# Patient Record
Sex: Male | Born: 1980 | Race: Black or African American | Hispanic: No | Marital: Single | Smoking: Never smoker
Health system: Southern US, Community
[De-identification: ages and names within clinical notes are randomized; demographics above are authoritative.]

## PROBLEM LIST (undated history)

## (undated) DIAGNOSIS — I10 Essential (primary) hypertension: Secondary | ICD-10-CM

## (undated) DIAGNOSIS — I639 Cerebral infarction, unspecified: Secondary | ICD-10-CM

---

## 2005-10-08 ENCOUNTER — Emergency Department (HOSPITAL_COMMUNITY): Admission: EM | Admit: 2005-10-08 | Discharge: 2005-10-08 | Payer: Self-pay | Admitting: Emergency Medicine

## 2005-10-28 ENCOUNTER — Emergency Department (HOSPITAL_COMMUNITY): Admission: EM | Admit: 2005-10-28 | Discharge: 2005-10-28 | Payer: Self-pay | Admitting: Emergency Medicine

## 2020-10-25 ENCOUNTER — Emergency Department (HOSPITAL_COMMUNITY): Payer: Medicaid Other

## 2020-10-25 ENCOUNTER — Other Ambulatory Visit: Payer: Self-pay

## 2020-10-25 ENCOUNTER — Encounter (HOSPITAL_COMMUNITY): Payer: Self-pay | Admitting: Emergency Medicine

## 2020-10-25 ENCOUNTER — Inpatient Hospital Stay (HOSPITAL_COMMUNITY)
Admission: EM | Admit: 2020-10-25 | Discharge: 2020-11-12 | DRG: 023 | Disposition: E | Payer: Medicaid Other | Attending: Neurology | Admitting: Neurology

## 2020-10-25 DIAGNOSIS — J80 Acute respiratory distress syndrome: Secondary | ICD-10-CM | POA: Diagnosis present

## 2020-10-25 DIAGNOSIS — Z09 Encounter for follow-up examination after completed treatment for conditions other than malignant neoplasm: Secondary | ICD-10-CM

## 2020-10-25 DIAGNOSIS — R578 Other shock: Secondary | ICD-10-CM | POA: Diagnosis not present

## 2020-10-25 DIAGNOSIS — G9382 Brain death: Secondary | ICD-10-CM | POA: Diagnosis not present

## 2020-10-25 DIAGNOSIS — Z0189 Encounter for other specified special examinations: Secondary | ICD-10-CM

## 2020-10-25 DIAGNOSIS — R509 Fever, unspecified: Secondary | ICD-10-CM

## 2020-10-25 DIAGNOSIS — I63412 Cerebral infarction due to embolism of left middle cerebral artery: Principal | ICD-10-CM | POA: Diagnosis present

## 2020-10-25 DIAGNOSIS — J9690 Respiratory failure, unspecified, unspecified whether with hypoxia or hypercapnia: Secondary | ICD-10-CM

## 2020-10-25 DIAGNOSIS — J690 Pneumonitis due to inhalation of food and vomit: Secondary | ICD-10-CM | POA: Diagnosis present

## 2020-10-25 DIAGNOSIS — F17210 Nicotine dependence, cigarettes, uncomplicated: Secondary | ICD-10-CM | POA: Diagnosis present

## 2020-10-25 DIAGNOSIS — J189 Pneumonia, unspecified organism: Secondary | ICD-10-CM | POA: Diagnosis not present

## 2020-10-25 DIAGNOSIS — I130 Hypertensive heart and chronic kidney disease with heart failure and stage 1 through stage 4 chronic kidney disease, or unspecified chronic kidney disease: Secondary | ICD-10-CM | POA: Diagnosis present

## 2020-10-25 DIAGNOSIS — E870 Hyperosmolality and hypernatremia: Secondary | ICD-10-CM | POA: Diagnosis not present

## 2020-10-25 DIAGNOSIS — N184 Chronic kidney disease, stage 4 (severe): Secondary | ICD-10-CM | POA: Diagnosis present

## 2020-10-25 DIAGNOSIS — Z79899 Other long term (current) drug therapy: Secondary | ICD-10-CM

## 2020-10-25 DIAGNOSIS — E1122 Type 2 diabetes mellitus with diabetic chronic kidney disease: Secondary | ICD-10-CM | POA: Diagnosis present

## 2020-10-25 DIAGNOSIS — I611 Nontraumatic intracerebral hemorrhage in hemisphere, cortical: Secondary | ICD-10-CM | POA: Diagnosis present

## 2020-10-25 DIAGNOSIS — G9349 Other encephalopathy: Secondary | ICD-10-CM | POA: Diagnosis present

## 2020-10-25 DIAGNOSIS — Z9289 Personal history of other medical treatment: Secondary | ICD-10-CM

## 2020-10-25 DIAGNOSIS — E669 Obesity, unspecified: Secondary | ICD-10-CM | POA: Diagnosis present

## 2020-10-25 DIAGNOSIS — Z452 Encounter for adjustment and management of vascular access device: Secondary | ICD-10-CM

## 2020-10-25 DIAGNOSIS — N179 Acute kidney failure, unspecified: Secondary | ICD-10-CM | POA: Diagnosis present

## 2020-10-25 DIAGNOSIS — R29704 NIHSS score 4: Secondary | ICD-10-CM | POA: Diagnosis present

## 2020-10-25 DIAGNOSIS — F1220 Cannabis dependence, uncomplicated: Secondary | ICD-10-CM | POA: Diagnosis present

## 2020-10-25 DIAGNOSIS — E785 Hyperlipidemia, unspecified: Secondary | ICD-10-CM | POA: Diagnosis present

## 2020-10-25 DIAGNOSIS — J9691 Respiratory failure, unspecified with hypoxia: Secondary | ICD-10-CM

## 2020-10-25 DIAGNOSIS — Z978 Presence of other specified devices: Secondary | ICD-10-CM

## 2020-10-25 DIAGNOSIS — E876 Hypokalemia: Secondary | ICD-10-CM | POA: Diagnosis present

## 2020-10-25 DIAGNOSIS — I63442 Cerebral infarction due to embolism of left cerebellar artery: Secondary | ICD-10-CM | POA: Diagnosis present

## 2020-10-25 DIAGNOSIS — R6521 Severe sepsis with septic shock: Secondary | ICD-10-CM | POA: Diagnosis not present

## 2020-10-25 DIAGNOSIS — G911 Obstructive hydrocephalus: Secondary | ICD-10-CM | POA: Diagnosis present

## 2020-10-25 DIAGNOSIS — Z20822 Contact with and (suspected) exposure to covid-19: Secondary | ICD-10-CM | POA: Diagnosis present

## 2020-10-25 DIAGNOSIS — I5043 Acute on chronic combined systolic (congestive) and diastolic (congestive) heart failure: Secondary | ICD-10-CM | POA: Diagnosis present

## 2020-10-25 DIAGNOSIS — Z515 Encounter for palliative care: Secondary | ICD-10-CM

## 2020-10-25 DIAGNOSIS — Z4659 Encounter for fitting and adjustment of other gastrointestinal appliance and device: Secondary | ICD-10-CM

## 2020-10-25 DIAGNOSIS — K7201 Acute and subacute hepatic failure with coma: Secondary | ICD-10-CM | POA: Diagnosis present

## 2020-10-25 DIAGNOSIS — I639 Cerebral infarction, unspecified: Secondary | ICD-10-CM

## 2020-10-25 DIAGNOSIS — E871 Hypo-osmolality and hyponatremia: Secondary | ICD-10-CM | POA: Diagnosis present

## 2020-10-25 DIAGNOSIS — G936 Cerebral edema: Secondary | ICD-10-CM | POA: Diagnosis present

## 2020-10-25 DIAGNOSIS — H518 Other specified disorders of binocular movement: Secondary | ICD-10-CM | POA: Diagnosis present

## 2020-10-25 DIAGNOSIS — I63512 Cerebral infarction due to unspecified occlusion or stenosis of left middle cerebral artery: Secondary | ICD-10-CM | POA: Diagnosis present

## 2020-10-25 DIAGNOSIS — I513 Intracardiac thrombosis, not elsewhere classified: Secondary | ICD-10-CM | POA: Diagnosis present

## 2020-10-25 DIAGNOSIS — Z6841 Body Mass Index (BMI) 40.0 and over, adult: Secondary | ICD-10-CM

## 2020-10-25 DIAGNOSIS — A419 Sepsis, unspecified organism: Secondary | ICD-10-CM | POA: Diagnosis not present

## 2020-10-25 DIAGNOSIS — E1165 Type 2 diabetes mellitus with hyperglycemia: Secondary | ICD-10-CM | POA: Diagnosis present

## 2020-10-25 DIAGNOSIS — G935 Compression of brain: Secondary | ICD-10-CM | POA: Diagnosis present

## 2020-10-25 DIAGNOSIS — I69351 Hemiplegia and hemiparesis following cerebral infarction affecting right dominant side: Secondary | ICD-10-CM

## 2020-10-25 DIAGNOSIS — I615 Nontraumatic intracerebral hemorrhage, intraventricular: Secondary | ICD-10-CM | POA: Diagnosis present

## 2020-10-25 HISTORY — DX: Cerebral infarction, unspecified: I63.9

## 2020-10-25 HISTORY — DX: Essential (primary) hypertension: I10

## 2020-10-25 LAB — CBC
HCT: 35.6 % — ABNORMAL LOW (ref 39.0–52.0)
Hemoglobin: 11.8 g/dL — ABNORMAL LOW (ref 13.0–17.0)
MCH: 25.8 pg — ABNORMAL LOW (ref 26.0–34.0)
MCHC: 33.1 g/dL (ref 30.0–36.0)
MCV: 77.7 fL — ABNORMAL LOW (ref 80.0–100.0)
Platelets: 476 10*3/uL — ABNORMAL HIGH (ref 150–400)
RBC: 4.58 MIL/uL (ref 4.22–5.81)
RDW: 16 % — ABNORMAL HIGH (ref 11.5–15.5)
WBC: 15.9 10*3/uL — ABNORMAL HIGH (ref 4.0–10.5)
nRBC: 1.4 % — ABNORMAL HIGH (ref 0.0–0.2)

## 2020-10-25 LAB — DIFFERENTIAL
Abs Immature Granulocytes: 0.18 10*3/uL — ABNORMAL HIGH (ref 0.00–0.07)
Basophils Absolute: 0 10*3/uL (ref 0.0–0.1)
Basophils Relative: 0 %
Eosinophils Absolute: 0 10*3/uL (ref 0.0–0.5)
Eosinophils Relative: 0 %
Immature Granulocytes: 1 %
Lymphocytes Relative: 8 %
Lymphs Abs: 1.2 10*3/uL (ref 0.7–4.0)
Monocytes Absolute: 1 10*3/uL (ref 0.1–1.0)
Monocytes Relative: 7 %
Neutro Abs: 13.4 10*3/uL — ABNORMAL HIGH (ref 1.7–7.7)
Neutrophils Relative %: 84 %

## 2020-10-25 LAB — COMPREHENSIVE METABOLIC PANEL
ALT: 66 U/L — ABNORMAL HIGH (ref 0–44)
AST: 40 U/L (ref 15–41)
Albumin: 1.5 g/dL — ABNORMAL LOW (ref 3.5–5.0)
Alkaline Phosphatase: 207 U/L — ABNORMAL HIGH (ref 38–126)
Anion gap: 11 (ref 5–15)
BUN: 41 mg/dL — ABNORMAL HIGH (ref 6–20)
CO2: 23 mmol/L (ref 22–32)
Calcium: 7.8 mg/dL — ABNORMAL LOW (ref 8.9–10.3)
Chloride: 93 mmol/L — ABNORMAL LOW (ref 98–111)
Creatinine, Ser: 2 mg/dL — ABNORMAL HIGH (ref 0.61–1.24)
GFR, Estimated: 43 mL/min — ABNORMAL LOW (ref >60)
Glucose, Bld: 289 mg/dL — ABNORMAL HIGH (ref 70–99)
Potassium: 3.4 mmol/L — ABNORMAL LOW (ref 3.5–5.1)
Sodium: 127 mmol/L — ABNORMAL LOW (ref 135–145)
Total Bilirubin: 1.2 mg/dL (ref 0.3–1.2)
Total Protein: 7 g/dL (ref 6.5–8.1)

## 2020-10-25 LAB — PROTIME-INR
INR: 1.4 — ABNORMAL HIGH (ref 0.8–1.2)
Prothrombin Time: 16.7 seconds — ABNORMAL HIGH (ref 11.4–15.2)

## 2020-10-25 LAB — CBG MONITORING, ED: Glucose-Capillary: 281 mg/dL — ABNORMAL HIGH (ref 70–99)

## 2020-10-25 LAB — APTT: aPTT: 27 seconds (ref 24–36)

## 2020-10-25 LAB — ETHANOL: Alcohol, Ethyl (B): <10 mg/dL (ref <10)

## 2020-10-25 MED ORDER — ALTEPLASE (STROKE) FULL DOSE INFUSION
90.0000 mg | Freq: Once | INTRAVENOUS | Status: AC
  Administered 2020-10-26: 81 mg via INTRAVENOUS
  Filled 2020-10-25: qty 100

## 2020-10-25 MED ORDER — LABETALOL HCL 5 MG/ML IV SOLN
10.0000 mg | Freq: Once | INTRAVENOUS | Status: AC
  Administered 2020-10-25: 10 mg via INTRAVENOUS

## 2020-10-25 MED ORDER — CLEVIDIPINE BUTYRATE 0.5 MG/ML IV EMUL
0.0000 mg/h | INTRAVENOUS | Status: DC
  Administered 2020-10-26: 10 mg/h via INTRAVENOUS
  Administered 2020-10-26: 14 mg/h via INTRAVENOUS
  Administered 2020-10-26: 15 mg/h via INTRAVENOUS
  Administered 2020-10-26: 10 mg/h via INTRAVENOUS
  Administered 2020-10-26: 5 mg/h via INTRAVENOUS
  Filled 2020-10-25 (×6): qty 50

## 2020-10-25 MED ORDER — CLEVIDIPINE BUTYRATE 0.5 MG/ML IV EMUL
INTRAVENOUS | Status: AC
  Administered 2020-10-25: 2 mg/h via INTRAVENOUS
  Filled 2020-10-25: qty 50

## 2020-10-25 MED ORDER — SODIUM CHLORIDE 0.9 % IV SOLN
50.0000 mL | Freq: Once | INTRAVENOUS | Status: AC
  Administered 2020-10-26: 50 mL via INTRAVENOUS

## 2020-10-25 NOTE — ED Triage Notes (Signed)
Patient reports sudden right arm numbness this evening LSN: 9:45 pm , hypertensive /Hx. Stroke , Code stroke status initiated .

## 2020-10-25 NOTE — ED Provider Notes (Signed)
MSE was initiated and I personally evaluated the patient and placed orders (if any) at  10:58 PM on October 25, 2020.  Pt presenting with acute onset R sided upper and lower ext weakness starting at 945 pm (1 hr prior to arrival). H/o HTN and previous CVA. Residual R sided weakness, but not this this extent. He is not on any medications 2/2 recently being released from jail for 15 yrs.    PE:  R sided upper and lower ext strength deficits with sensatory deficits. CN intact. Slurring noted. Speaking in full sentences. Airway intact.  Code stroke called from triage. Pt sent to CT scanner.   The patient appears stable so that the remainder of the MSE may be completed by another provider.   Franchot Heidelberg, PA-C 10/15/2020 2300    Ripley Fraise, MD 10/13/2020 [phone]

## 2020-10-25 NOTE — ED Notes (Signed)
Jesus Osborne [phone]) girlfriend, req call with update on pt.

## 2020-10-25 NOTE — Progress Notes (Signed)
Pharmacist Code Stroke Response  Notified to mix tPA at 2311 by Dr. Janeann Forehand  Delivered tPA to RN at 2316  tPA dose = '9mg'$  bolus over 1 minute followed by '81mg'$  for a total dose of '90mg'$  over 1 hour  Issues/delays encountered (if applicable): Blood pressure control requiring labetalol and cleviprex   Albertina Parr, PharmD., BCPS, BCCCP Clinical Pharmacist Please refer to [facility] for unit-specific pharmacist

## 2020-10-25 NOTE — H&P (Addendum)
NEUROLOGY CONSULTATION NOTE   Date of service: October 25, 2020 Patient Name: Jesus Osborne MRN:  [id number] DOB:  [birth year] Reason for consult: "Stroke code" _ _ _   _ __   _ __ _ _  __ __   _ __   __ _  History of Present Illness  GERARDUS BRINK is a 40 y.o. male with PMH significant for hx of HTN, prior stroke(about 7 years ago per patient) with mild R sided weakness, uses a cane to walk who presents with R arm numbness and worsening of his R sided weakness. He was sitting at his porch eating salad when he had sudden onset R sided weakness, mostly with his arm and also his leg. He is R handed. Writes with his R hand. Uses a cane but can walk around fine. Reports had a stroke 7 years ago with residual mild R sided weakness. Also endorses shortness of breath since yesterday which has gotten worse today.  Smokes marijuana and cigarettes, does not take any medications at home. CTH with L MCA stroke, post central gyrus.   MRS: 0 LKW: 2145 on 10/16/2020. NIHSS: 4 TPA: Yes given. Decision to offer tPA made at 2311. tPA was given at [place]. Delay to tPA was due to following: 1. Difficult stick and multiple attempts had to made before IV could be established by PIV team. 2. Diastolic blood pressure was significantly elevated at presentation and required labetalol '10mg'$  x 2, followed by Cleviprex gtt which was uptitrated a few times before SBP could be brought to less than 110.   Thrombectomy: No LVO. Not a candidate.  NIHSS components Score: Comment  1a Level of Conscious 0'[x]'$  1'[]'$  2'[]'$  3'[]'$      1b LOC Questions 0'[x]'$  1'[]'$  2'[]'$       1c LOC Commands 0'[x]'$  1'[]'$  2'[]'$       2 Best Gaze 0'[x]'$  1'[]'$  2'[]'$       3 Visual 0'[x]'$  1'[]'$  2'[]'$  3'[]'$      4 Facial Palsy 0'[x]'$  1'[]'$  2'[]'$  3'[]'$      5a Motor Arm - left 0'[x]'$  1'[]'$  2'[]'$  3'[]'$  4'[]'$  UN'[]'$    5b Motor Arm - Right 0'[]'$  1'[x]'$  2'[]'$  3'[]'$  4'[]'$  UN'[]'$    6a Motor Leg - Left 0'[]'$  1'[]'$  2'[]'$  3'[]'$  4'[]'$  UN'[]'$    6b Motor Leg - Right 0'[]'$  1'[x]'$  2'[]'$  3'[]'$  4'[]'$  UN'[]'$    7 Limb Ataxia 0'[]'$  1'[x]'$  2'[]'$  3'[]'$  UN'[]'$     8 Sensory 0'[]'$   1'[x]'$  2'[]'$  UN'[]'$      9 Best Language 0'[x]'$  1'[]'$  2'[]'$  3'[]'$      10 Dysarthria 0'[x]'$  1'[]'$  2'[]'$  UN'[]'$      11 Extinct. and Inattention 0'[x]'$  1'[]'$  2'[]'$       TOTAL: 4        ROS   Constitutional Denies weight loss, fever and chills.  HEENT Denies changes in vision and hearing.  Respiratory Denies SOB and cough.  CV Denies palpitations and CP  GI Denies abdominal pain, nausea, vomiting and diarrhea.  GU Denies dysuria and urinary frequency.  MSK Denies myalgia and joint pain.  Skin Denies rash and pruritus.  Neurological Denies headache and syncope.  Psychiatric Denies recent changes in mood. Denies anxiety and depression.   Past History   Past Medical History:  Diagnosis Date  . Hypertension   . Stroke [facility])    History reviewed. No pertinent surgical history. No family history on file. Social History   Socioeconomic History  . Marital status: Single    Spouse name: Not on file  . Number of  children: Not on file  . Years of education: Not on file  . Highest education level: Not on file  Occupational History  . Not on file  Tobacco Use  . Smoking status: Never Smoker  . Smokeless tobacco: Never Used  Substance and Sexual Activity  . Alcohol use: Never  . Drug use: Never  . Sexual activity: Not on file  Other Topics Concern  . Not on file  Social History Narrative  . Not on file   Social Determinants of Health   Financial Resource Strain: Not on file  Food Insecurity: Not on file  Transportation Needs: Not on file  Physical Activity: Not on file  Stress: Not on file  Social Connections: Not on file   No Known Allergies  Medications  (Not in a hospital admission)    Vitals   Vitals:   10/27/2020 2244 10/15/2020 2254  BP: (!) 164/129   Pulse: (!) 108   Resp: (!) 21   Temp: 98.3 F (36.8 C)   TempSrc: Oral   SpO2: 90%   Weight:  (!) 152 kg  Height:  '5\' 9"'$  (1.753 m)     Body mass index is 49.49 kg/m.  Physical Exam   General: Laying comfortably in bed; in no  acute distress. HENT: Normal oropharynx and mucosa. Normal external appearance of ears and nose. Neck: Supple, no pain or tenderness CV: No JVD. No peripheral edema. Pulmonary: Symmetric Chest rise. Tachypneic. Abdomen: Soft to touch, non-tender. Ext: No cyanosis, edema, or deformity Skin: No rash. Normal palpation of skin.  Musculoskeletal: Normal digits and nails by inspection. No clubbing.  Neurologic Examination  Mental status/Cognition: Alert, oriented to self, place, month and year, good attention. Speech/language: Fluent, comprehension intact, object naming intact, repetition intact. Cranial nerves:   CN II Pupils equal and reactive to light, tunneled vision.   CN III,IV,VI EOM intact, no gaze preference or deviation, no nystagmus   CN V normal sensation in V1, V2, and V3 segments bilaterally   CN VII no asymmetry, no nasolabial fold flattening   CN VIII normal hearing to speech   CN IX & X normal palatal elevation, no uvular deviation   CN XI 5/5 head turn and 5/5 shoulder shrug bilaterally   CN XII midline tongue protrusion   Motor:  Muscle bulk: normal, tone normal, pronator drift RUE drift tremor none Mvmt Root Nerve  Muscle Right Left Comments  SA C5/6 Ax Deltoid 4+ 5   EF C5/6 Mc Biceps 5 5   EE C6/7/8 Rad Triceps 5 5   WF C6/7 Med FCR     WE C7/8 PIN ECU     F Ab C8/T1 U ADM/FDI 4+ 5   HF L1/2/3 Fem Illopsoas 4 5   KE L2/3/4 Fem Quad 5 5   DF L4/5 D Peron Tib Ant 5 5   PF S1/2 Tibial Grc/Sol 5 5    Reflexes:  Right Left Comments  Pectoralis      Biceps (C5/6)     Brachioradialis (C5/6)      Triceps (C6/7)      Patellar (L3/4)      Achilles (S1)      Hoffman      Plantar     Jaw jerk    Sensation:  Light touch    Pin prick    Temperature    Vibration   Proprioception    Coordination/Complex Motor:  - Finger to Nose with RUE ataxia. - Heel to shin unable to  do on the Right. - Gait: Deferred.  Labs   CBC:  Recent Labs  Lab 10/20/2020 2254   WBC 15.9*  NEUTROABS 13.4*  HGB 11.8*  HCT 35.6*  MCV 77.7*  PLT 476*    Basic Metabolic Panel: No results found for: NA, K, CO2, GLUCOSE, BUN, CREATININE, CALCIUM, GFRNONAA, GFRAA, GLUCOSE Lipid Panel: No results found for: LDLCALC HgbA1c: No results found for: HGBA1C Urine Drug Screen: No results found for: LABOPIA, COCAINSCRNUR, LABBENZ, AMPHETMU, THCU, LABBARB  Alcohol Level No results found for: [place]  CT Head without contrast: L MCA post central gyrus wedge shaped are of loss of gray white differentiation concerning for an acute stroke. ASPECTs of 9.  CT angio Head and Neck with contrast: No LVO on my read.  No significant mismatch on my read. Mostly artifact.  MRI Brain pending  Impression   LUISENRIQUE SHAM is a 40 y.o. male with PMH significant for HTN, prior stroke 7 years ago with mild residual R sided weakness who presents with shortness of breath x 1 day and acute onset R arm numbness, worsening of baseline R arm and leg weakness with a LKW of 2145. CTH with left post central gyrus hypodensity concerning for an acute stroke. CTA with no LVO.  Recommendations  Plan: - Frequent NeuroChecks for post tPA care per stroke unit protocol: - Initial CTH demonstrated no acute hemorrhage or mass - MRI Brain - pending. - CTA - no LVO - TTE - pending. - Lipid Panel: LDL - pending.  - Statin: if LDL > 70 - HbA1c: pending. - Antithrombotic: Start ASA 81 mg daily if 24 h CTH does not show acute hemorrhage - DVT prophylaxis: SCDs. Pharmacologic prophylaxis if 24 h CTH does not demonstrate acute hemorrhage - Smoking cessation: discussed with patient. - Systolic Blood Pressure goal: < 180 mm Hg with DBP < 105. On Cleviprex now. - Telemetry monitoring for arrhythmia: 72 hours - Swallow screen - ordered - PT/OT/SLP consults - Hypercoagulable workup  Shortness of breath: with sats down to 70s in the CT scanner when lying flat. - CT Angio with potential concern for R lung  Pneumonia. - Oxygen PRN to keep sats > 92% - Will get Pulm on board to assist with workup.  ______________________________________________________________________   This patient is critically ill and at significant risk of neurological worsening, death and care requires constant monitoring of vital signs, hemodynamics,respiratory and cardiac monitoring, neurological assessment, discussion with family, other specialists and medical decision making of high complexity. I spent 60 minutes of neurocritical care time  in the care of  this patient. This was time spent independent of any time provided by nurse practitioner or PA.  Donnetta Simpers [facility] Pager Number [phone] 10/26/2020  12:50 AM   Signed,  Donnetta Simpers [facility] Pager Number [phone] _ _ _   _ __   _ __ _ _  __ __   _ __   __ _

## 2020-10-26 ENCOUNTER — Inpatient Hospital Stay (HOSPITAL_COMMUNITY): Payer: Medicaid Other

## 2020-10-26 DIAGNOSIS — Z6841 Body Mass Index (BMI) 40.0 and over, adult: Secondary | ICD-10-CM | POA: Diagnosis not present

## 2020-10-26 DIAGNOSIS — N184 Chronic kidney disease, stage 4 (severe): Secondary | ICD-10-CM | POA: Diagnosis present

## 2020-10-26 DIAGNOSIS — I69351 Hemiplegia and hemiparesis following cerebral infarction affecting right dominant side: Secondary | ICD-10-CM | POA: Diagnosis not present

## 2020-10-26 DIAGNOSIS — G936 Cerebral edema: Secondary | ICD-10-CM | POA: Diagnosis present

## 2020-10-26 DIAGNOSIS — G9349 Other encephalopathy: Secondary | ICD-10-CM | POA: Diagnosis present

## 2020-10-26 DIAGNOSIS — I611 Nontraumatic intracerebral hemorrhage in hemisphere, cortical: Secondary | ICD-10-CM | POA: Diagnosis present

## 2020-10-26 DIAGNOSIS — A419 Sepsis, unspecified organism: Secondary | ICD-10-CM | POA: Diagnosis not present

## 2020-10-26 DIAGNOSIS — I63512 Cerebral infarction due to unspecified occlusion or stenosis of left middle cerebral artery: Secondary | ICD-10-CM

## 2020-10-26 DIAGNOSIS — N179 Acute kidney failure, unspecified: Secondary | ICD-10-CM | POA: Diagnosis present

## 2020-10-26 DIAGNOSIS — I63412 Cerebral infarction due to embolism of left middle cerebral artery: Secondary | ICD-10-CM | POA: Diagnosis present

## 2020-10-26 DIAGNOSIS — Z20822 Contact with and (suspected) exposure to covid-19: Secondary | ICD-10-CM | POA: Diagnosis present

## 2020-10-26 DIAGNOSIS — G935 Compression of brain: Secondary | ICD-10-CM | POA: Diagnosis present

## 2020-10-26 DIAGNOSIS — J189 Pneumonia, unspecified organism: Secondary | ICD-10-CM | POA: Diagnosis not present

## 2020-10-26 DIAGNOSIS — K7201 Acute and subacute hepatic failure with coma: Secondary | ICD-10-CM | POA: Diagnosis present

## 2020-10-26 DIAGNOSIS — J9601 Acute respiratory failure with hypoxia: Secondary | ICD-10-CM

## 2020-10-26 DIAGNOSIS — G911 Obstructive hydrocephalus: Secondary | ICD-10-CM | POA: Diagnosis present

## 2020-10-26 DIAGNOSIS — G9340 Encephalopathy, unspecified: Secondary | ICD-10-CM | POA: Insufficient documentation

## 2020-10-26 DIAGNOSIS — R6521 Severe sepsis with septic shock: Secondary | ICD-10-CM | POA: Diagnosis not present

## 2020-10-26 DIAGNOSIS — E871 Hypo-osmolality and hyponatremia: Secondary | ICD-10-CM | POA: Diagnosis present

## 2020-10-26 DIAGNOSIS — E870 Hyperosmolality and hypernatremia: Secondary | ICD-10-CM | POA: Diagnosis not present

## 2020-10-26 DIAGNOSIS — J690 Pneumonitis due to inhalation of food and vomit: Secondary | ICD-10-CM | POA: Diagnosis present

## 2020-10-26 DIAGNOSIS — I10 Essential (primary) hypertension: Secondary | ICD-10-CM

## 2020-10-26 DIAGNOSIS — J9690 Respiratory failure, unspecified, unspecified whether with hypoxia or hypercapnia: Secondary | ICD-10-CM | POA: Diagnosis present

## 2020-10-26 DIAGNOSIS — G9382 Brain death: Secondary | ICD-10-CM | POA: Diagnosis not present

## 2020-10-26 DIAGNOSIS — Z515 Encounter for palliative care: Secondary | ICD-10-CM | POA: Diagnosis not present

## 2020-10-26 DIAGNOSIS — I130 Hypertensive heart and chronic kidney disease with heart failure and stage 1 through stage 4 chronic kidney disease, or unspecified chronic kidney disease: Secondary | ICD-10-CM | POA: Diagnosis present

## 2020-10-26 DIAGNOSIS — I5043 Acute on chronic combined systolic (congestive) and diastolic (congestive) heart failure: Secondary | ICD-10-CM | POA: Diagnosis present

## 2020-10-26 DIAGNOSIS — I615 Nontraumatic intracerebral hemorrhage, intraventricular: Secondary | ICD-10-CM | POA: Diagnosis present

## 2020-10-26 DIAGNOSIS — J80 Acute respiratory distress syndrome: Secondary | ICD-10-CM | POA: Diagnosis present

## 2020-10-26 LAB — URINALYSIS, ROUTINE W REFLEX MICROSCOPIC
Bilirubin Urine: NEGATIVE
Glucose, UA: 50 mg/dL — AB
Ketones, ur: NEGATIVE mg/dL
Leukocytes,Ua: NEGATIVE
Nitrite: NEGATIVE
Protein, ur: >=300 mg/dL — AB
Specific Gravity, Urine: 1.028 (ref 1.005–1.030)
pH: 5 (ref 5.0–8.0)

## 2020-10-26 LAB — SEDIMENTATION RATE: Sed Rate: 79 mm/hr — ABNORMAL HIGH (ref 0–16)

## 2020-10-26 LAB — I-STAT ARTERIAL BLOOD GAS, ED
Acid-Base Excess: 1 mmol/L (ref 0.0–2.0)
Bicarbonate: 27.1 mmol/L (ref 20.0–28.0)
Calcium, Ion: 1.1 mmol/L — ABNORMAL LOW (ref 1.15–1.40)
HCT: 38 % — ABNORMAL LOW (ref 39.0–52.0)
Hemoglobin: 12.9 g/dL — ABNORMAL LOW (ref 13.0–17.0)
O2 Saturation: 95 %
Patient temperature: 98.6
Potassium: 3.6 mmol/L (ref 3.5–5.1)
Sodium: 129 mmol/L — ABNORMAL LOW (ref 135–145)
TCO2: 29 mmol/L (ref 22–32)
pCO2 arterial: 47 mmHg (ref 32.0–48.0)
pH, Arterial: 7.369 (ref 7.350–7.450)
pO2, Arterial: 81 mmHg — ABNORMAL LOW (ref 83.0–108.0)

## 2020-10-26 LAB — I-STAT CHEM 8, ED
BUN: 40 mg/dL — ABNORMAL HIGH (ref 6–20)
Calcium, Ion: 0.96 mmol/L — ABNORMAL LOW (ref 1.15–1.40)
Chloride: 93 mmol/L — ABNORMAL LOW (ref 98–111)
Creatinine, Ser: 2 mg/dL — ABNORMAL HIGH (ref 0.61–1.24)
Glucose, Bld: 284 mg/dL — ABNORMAL HIGH (ref 70–99)
HCT: 37 % — ABNORMAL LOW (ref 39.0–52.0)
Hemoglobin: 12.6 g/dL — ABNORMAL LOW (ref 13.0–17.0)
Potassium: 3.3 mmol/L — ABNORMAL LOW (ref 3.5–5.1)
Sodium: 129 mmol/L — ABNORMAL LOW (ref 135–145)
TCO2: 23 mmol/L (ref 22–32)

## 2020-10-26 LAB — PROCALCITONIN: Procalcitonin: 0.96 ng/mL

## 2020-10-26 LAB — HEMOGLOBIN A1C
Hgb A1c MFr Bld: 8.3 % — ABNORMAL HIGH (ref 4.8–5.6)
Mean Plasma Glucose: 191.51 mg/dL

## 2020-10-26 LAB — ECHOCARDIOGRAM COMPLETE BUBBLE STUDY
Area-P 1/2: 4.06 cm2
Calc EF: 40.3 %
S' Lateral: 4.5 cm
Single Plane A2C EF: 40.7 %
Single Plane A4C EF: 41.4 %

## 2020-10-26 LAB — BASIC METABOLIC PANEL
Anion gap: 9 (ref 5–15)
BUN: 39 mg/dL — ABNORMAL HIGH (ref 6–20)
CO2: 29 mmol/L (ref 22–32)
Calcium: 8.3 mg/dL — ABNORMAL LOW (ref 8.9–10.3)
Chloride: 98 mmol/L (ref 98–111)
Creatinine, Ser: 2.02 mg/dL — ABNORMAL HIGH (ref 0.61–1.24)
GFR, Estimated: 42 mL/min — ABNORMAL LOW (ref >60)
Glucose, Bld: 124 mg/dL — ABNORMAL HIGH (ref 70–99)
Potassium: 3.1 mmol/L — ABNORMAL LOW (ref 3.5–5.1)
Sodium: 136 mmol/L (ref 135–145)

## 2020-10-26 LAB — GLUCOSE, CAPILLARY
Glucose-Capillary: 232 mg/dL — ABNORMAL HIGH (ref 70–99)
Glucose-Capillary: 147 mg/dL — ABNORMAL HIGH (ref 70–99)
Glucose-Capillary: 125 mg/dL — ABNORMAL HIGH (ref 70–99)
Glucose-Capillary: 150 mg/dL — ABNORMAL HIGH (ref 70–99)
Glucose-Capillary: 100 mg/dL — ABNORMAL HIGH (ref 70–99)

## 2020-10-26 LAB — STREP PNEUMONIAE URINARY ANTIGEN: Strep Pneumo Urinary Antigen: NEGATIVE

## 2020-10-26 LAB — SODIUM: Sodium: 134 mmol/L — ABNORMAL LOW (ref 135–145)

## 2020-10-26 LAB — RAPID URINE DRUG SCREEN, HOSP PERFORMED
Amphetamines: NOT DETECTED
Barbiturates: NOT DETECTED
Benzodiazepines: NOT DETECTED
Cocaine: NOT DETECTED
Opiates: NOT DETECTED
Tetrahydrocannabinol: POSITIVE — AB

## 2020-10-26 LAB — MRSA PCR SCREENING: MRSA by PCR: NEGATIVE

## 2020-10-26 LAB — LIPID PANEL
Cholesterol: 101 mg/dL (ref 0–200)
HDL: 24 mg/dL — ABNORMAL LOW (ref >40)
LDL Cholesterol: 52 mg/dL (ref 0–99)
Total CHOL/HDL Ratio: 4.2 RATIO
Triglycerides: 127 mg/dL (ref <150)
VLDL: 25 mg/dL (ref 0–40)

## 2020-10-26 LAB — RESP PANEL BY RT-PCR (FLU A&B, COVID) ARPGX2
Influenza A by PCR: NEGATIVE
Influenza B by PCR: NEGATIVE
SARS Coronavirus 2 by RT PCR: NEGATIVE

## 2020-10-26 LAB — BRAIN NATRIURETIC PEPTIDE: B Natriuretic Peptide: 1539.5 pg/mL — ABNORMAL HIGH (ref 0.0–100.0)

## 2020-10-26 LAB — C-REACTIVE PROTEIN: CRP: <0.5 mg/dL (ref <1.0)

## 2020-10-26 LAB — HIV ANTIBODY (ROUTINE TESTING W REFLEX): HIV Screen 4th Generation wRfx: NONREACTIVE

## 2020-10-26 LAB — ANTITHROMBIN III: AntiThromb III Func: 79 % (ref 75–120)

## 2020-10-26 MED ORDER — FENTANYL CITRATE (PF) 100 MCG/2ML IJ SOLN: 50.0000 ug | INTRAMUSCULAR | Status: DC | PRN

## 2020-10-26 MED ORDER — PROPOFOL 1000 MG/100ML IV EMUL
0.0000 ug/kg/min | INTRAVENOUS | Status: DC
  Administered 2020-10-26: 5 ug/kg/min via INTRAVENOUS
  Administered 2020-10-26: 30 ug/kg/min via INTRAVENOUS
  Administered 2020-10-27: 15 ug/kg/min via INTRAVENOUS
  Administered 2020-10-27: 30 ug/kg/min via INTRAVENOUS
  Filled 2020-10-26 (×4): qty 100

## 2020-10-26 MED ORDER — ACETAMINOPHEN 325 MG PO TABS
650.0000 mg | ORAL_TABLET | ORAL | Status: DC | PRN
Start: 2020-10-26 — End: 2020-11-05

## 2020-10-26 MED ORDER — SENNOSIDES-DOCUSATE SODIUM 8.6-50 MG PO TABS: 1.0000 | ORAL_TABLET | Freq: Every evening | ORAL | Status: DC | PRN

## 2020-10-26 MED ORDER — FENTANYL CITRATE (PF) 100 MCG/2ML IJ SOLN
50.0000 ug | INTRAMUSCULAR | Status: DC | PRN
  Administered 2020-10-27 – 2020-10-28 (×4): 100 ug via INTRAVENOUS
  Administered 2020-10-31: 200 ug via INTRAVENOUS
  Administered 2020-10-31 – 2020-11-01 (×4): 100 ug via INTRAVENOUS
  Administered 2020-11-01: 200 ug via INTRAVENOUS
  Administered 2020-11-01: 100 ug via INTRAVENOUS
  Filled 2020-10-26: qty 2
  Filled 2020-10-26: qty 4
  Filled 2020-10-26 (×7): qty 2
  Filled 2020-10-26 (×2): qty 4

## 2020-10-26 MED ORDER — LABETALOL HCL 5 MG/ML IV SOLN
10.0000 mg | INTRAVENOUS | Status: DC | PRN
  Administered 2020-10-27 – 2020-10-28 (×2): 10 mg via INTRAVENOUS
  Filled 2020-10-26 (×2): qty 4

## 2020-10-26 MED ORDER — CHLORHEXIDINE GLUCONATE 0.12% ORAL RINSE (MEDLINE KIT)
15.0000 mL | Freq: Two times a day (BID) | OROMUCOSAL | Status: DC
  Administered 2020-10-26 – 2020-11-05 (×21): 15 mL via OROMUCOSAL

## 2020-10-26 MED ORDER — ACETAMINOPHEN 650 MG RE SUPP: 650.0000 mg | RECTAL | Status: DC | PRN

## 2020-10-26 MED ORDER — SODIUM CHLORIDE 0.9 % IV SOLN
3000.0000 mg | Freq: Once | INTRAVENOUS | Status: AC
  Administered 2020-10-26: 3000 mg via INTRAVENOUS
  Filled 2020-10-26: qty 30

## 2020-10-26 MED ORDER — SUCCINYLCHOLINE CHLORIDE 20 MG/ML IJ SOLN
INTRAMUSCULAR | Status: AC | PRN
  Administered 2020-10-26: 200 mg via INTRAVENOUS

## 2020-10-26 MED ORDER — ROCURONIUM BROMIDE 10 MG/ML (PF) SYRINGE
PREFILLED_SYRINGE | INTRAVENOUS | Status: AC
  Filled 2020-10-26: qty 10

## 2020-10-26 MED ORDER — HEPARIN (PORCINE) 25000 UT/250ML-% IV SOLN
1150.0000 [IU]/h | INTRAVENOUS | Status: DC
  Administered 2020-10-26: 1150 [IU]/h via INTRAVENOUS
  Filled 2020-10-26: qty 250

## 2020-10-26 MED ORDER — SODIUM CHLORIDE 3 % IV SOLN
INTRAVENOUS | Status: DC
  Filled 2020-10-26 (×5): qty 500

## 2020-10-26 MED ORDER — POLYETHYLENE GLYCOL 3350 17 G PO PACK
17.0000 g | PACK | Freq: Every day | ORAL | Status: DC
  Administered 2020-10-26 – 2020-10-28 (×3): 17 g
  Filled 2020-10-26 (×3): qty 1

## 2020-10-26 MED ORDER — SODIUM CHLORIDE 0.9 % IV SOLN
3000.0000 mg | Freq: Two times a day (BID) | INTRAVENOUS | Status: DC
  Filled 2020-10-26 (×2): qty 30

## 2020-10-26 MED ORDER — INSULIN ASPART 100 UNIT/ML ~~LOC~~ SOLN
0.0000 [IU] | SUBCUTANEOUS | Status: DC
  Administered 2020-10-26 (×2): 3 [IU] via SUBCUTANEOUS
  Administered 2020-10-26: 7 [IU] via SUBCUTANEOUS
  Administered 2020-10-26: 3 [IU] via SUBCUTANEOUS
  Administered 2020-10-27 – 2020-10-28 (×4): 4 [IU] via SUBCUTANEOUS
  Administered 2020-10-28: 3 [IU] via SUBCUTANEOUS
  Administered 2020-10-28 – 2020-10-29 (×5): 4 [IU] via SUBCUTANEOUS
  Administered 2020-10-29: 3 [IU] via SUBCUTANEOUS
  Administered 2020-10-29 – 2020-10-30 (×5): 4 [IU] via SUBCUTANEOUS
  Administered 2020-10-30 (×2): 3 [IU] via SUBCUTANEOUS
  Administered 2020-10-30: 4 [IU] via SUBCUTANEOUS
  Administered 2020-10-30: 3 [IU] via SUBCUTANEOUS
  Administered 2020-10-31: 7 [IU] via SUBCUTANEOUS
  Administered 2020-10-31 – 2020-11-02 (×10): 4 [IU] via SUBCUTANEOUS
  Administered 2020-11-02: 7 [IU] via SUBCUTANEOUS
  Administered 2020-11-02: 3 [IU] via SUBCUTANEOUS
  Administered 2020-11-02 (×2): 4 [IU] via SUBCUTANEOUS
  Administered 2020-11-03: 3 [IU] via SUBCUTANEOUS
  Administered 2020-11-03 (×3): 4 [IU] via SUBCUTANEOUS
  Administered 2020-11-03: 3 [IU] via SUBCUTANEOUS
  Administered 2020-11-04 – 2020-11-05 (×8): 4 [IU] via SUBCUTANEOUS
  Administered 2020-11-05: 11 [IU] via SUBCUTANEOUS

## 2020-10-26 MED ORDER — ACETAMINOPHEN 160 MG/5ML PO SOLN
650.0000 mg | ORAL | Status: DC | PRN
Start: 2020-10-26 — End: 2020-11-05
  Administered 2020-10-29 – 2020-11-04 (×10): 650 mg
  Filled 2020-10-26 (×10): qty 20.3

## 2020-10-26 MED ORDER — ALBUTEROL SULFATE (2.5 MG/3ML) 0.083% IN NEBU: 2.5000 mg | INHALATION_SOLUTION | Freq: Four times a day (QID) | RESPIRATORY_TRACT | Status: DC | PRN

## 2020-10-26 MED ORDER — SODIUM CHLORIDE 0.9 % IV SOLN
3.0000 g | Freq: Three times a day (TID) | INTRAVENOUS | Status: DC
  Administered 2020-10-26 – 2020-10-30 (×12): 3 g via INTRAVENOUS
  Filled 2020-10-26 (×2): qty 3
  Filled 2020-10-26: qty 8
  Filled 2020-10-26 (×2): qty 3
  Filled 2020-10-26 (×2): qty 8
  Filled 2020-10-26 (×2): qty 3
  Filled 2020-10-26: qty 8
  Filled 2020-10-26 (×4): qty 3
  Filled 2020-10-26: qty 8

## 2020-10-26 MED ORDER — SODIUM CHLORIDE 0.9 % IV SOLN: INTRAVENOUS | Status: DC

## 2020-10-26 MED ORDER — ETOMIDATE 2 MG/ML IV SOLN
INTRAVENOUS | Status: AC
  Filled 2020-10-26: qty 20

## 2020-10-26 MED ORDER — FUROSEMIDE 10 MG/ML IJ SOLN
40.0000 mg | Freq: Once | INTRAMUSCULAR | Status: AC
  Administered 2020-10-26: 40 mg via INTRAVENOUS
  Filled 2020-10-26: qty 4

## 2020-10-26 MED ORDER — PROPOFOL 1000 MG/100ML IV EMUL
INTRAVENOUS | Status: AC
  Filled 2020-10-26: qty 100

## 2020-10-26 MED ORDER — ETOMIDATE 2 MG/ML IV SOLN
INTRAVENOUS | Status: AC | PRN
  Administered 2020-10-26: 20 mg via INTRAVENOUS

## 2020-10-26 MED ORDER — PANTOPRAZOLE SODIUM 40 MG IV SOLR
40.0000 mg | Freq: Every day | INTRAVENOUS | Status: DC
  Administered 2020-10-26: 40 mg via INTRAVENOUS
  Filled 2020-10-26: qty 40

## 2020-10-26 MED ORDER — ORAL CARE MOUTH RINSE
15.0000 mL | OROMUCOSAL | Status: DC
  Administered 2020-10-26 – 2020-11-05 (×100): 15 mL via OROMUCOSAL

## 2020-10-26 MED ORDER — POTASSIUM CHLORIDE 20 MEQ PO PACK
40.0000 meq | PACK | ORAL | Status: AC
  Administered 2020-10-26 – 2020-10-27 (×3): 40 meq
  Filled 2020-10-26 (×3): qty 2

## 2020-10-26 MED ORDER — POTASSIUM CHLORIDE 20 MEQ PO PACK
40.0000 meq | PACK | Freq: Every day | ORAL | Status: DC
  Administered 2020-10-26: 40 meq
  Filled 2020-10-26: qty 2

## 2020-10-26 MED ORDER — STROKE: EARLY STAGES OF RECOVERY BOOK
Freq: Once | Status: AC
  Filled 2020-10-26: qty 1

## 2020-10-26 MED ORDER — LORAZEPAM 2 MG/ML IJ SOLN
INTRAMUSCULAR | Status: AC
  Administered 2020-10-26: 2 mg
  Filled 2020-10-26: qty 1

## 2020-10-26 MED ORDER — HEPARIN (PORCINE) 25000 UT/250ML-% IV SOLN
1650.0000 [IU]/h | INTRAVENOUS | Status: DC
  Administered 2020-10-27: 1150 [IU]/h via INTRAVENOUS
  Administered 2020-10-28: 2200 [IU]/h via INTRAVENOUS
  Administered 2020-10-28: 1800 [IU]/h via INTRAVENOUS
  Administered 2020-10-29: 2350 [IU]/h via INTRAVENOUS
  Administered 2020-10-29: 2400 [IU]/h via INTRAVENOUS
  Administered 2020-10-31 – 2020-11-01 (×3): 2300 [IU]/h via INTRAVENOUS
  Administered 2020-11-02: 1800 [IU]/h via INTRAVENOUS
  Filled 2020-10-26 (×13): qty 250

## 2020-10-26 MED ORDER — SUCCINYLCHOLINE CHLORIDE 200 MG/10ML IV SOSY
PREFILLED_SYRINGE | INTRAVENOUS | Status: AC
  Filled 2020-10-26: qty 10

## 2020-10-26 MED ORDER — CHLORHEXIDINE GLUCONATE CLOTH 2 % EX PADS
6.0000 | MEDICATED_PAD | Freq: Every day | CUTANEOUS | Status: DC
  Administered 2020-10-27: 6 via TOPICAL

## 2020-10-26 MED ORDER — IOHEXOL 350 MG/ML SOLN
100.0000 mL | Freq: Once | INTRAVENOUS | Status: AC | PRN
  Administered 2020-10-26: 100 mL via INTRAVENOUS

## 2020-10-26 MED ORDER — DOCUSATE SODIUM 50 MG/5ML PO LIQD
100.0000 mg | Freq: Two times a day (BID) | ORAL | Status: DC
  Administered 2020-10-26 – 2020-10-28 (×3): 100 mg
  Filled 2020-10-26 (×4): qty 10

## 2020-10-26 NOTE — Consult Note (Addendum)
NAME:  Jesus Osborne, MRN:  [id number], DOB:  Dec 27, 1980, LOS: 0 ADMISSION DATE:  10/12/2020, CONSULTATION DATE:  10/26/20 REFERRING MD:  Lorrin Goodell CHIEF COMPLAINT:  AMS   Brief History   Jesus Osborne is a 40 y.o. male who was admitted 3/14 with CVA s/p tPA.  While in [place], had hypoxia requiring NRB then 2L O2 via [place].  Later had change in mental status with minimal response; therefore, intubated before returning to CT.  History of present illness   Pt is encephelopathic; therefore, this HPI is obtained from chart review. Jesus Osborne is a 40 y.o. male who has a PMH including but not limited to prior CVA, HTN, THC and tobacco dependence (see "past medical history" for rest).  He presented to [facility] ED 3/14 with R arm numbness and R sided weakness.  He was taken to CT which was concerning for evolving L MCA infarct.  HE received tPA at 002 (slighly delay due to difficult IV access).  While in CT, he had desaturation requiring NRB and later placed on 2L O2 via [place]. While in ED, later had change in mental status around 0115 with left gaze deviation and decrease in alertness.  Decision made to intubate prior to taking back to CT for repeat scan.  Past Medical History  has Acute ischemic left MCA stroke ([place]) on their problem list.  [place] [facility] Events   3/14 > admit. 3/15 > intubated.  Consults:  PCCM.  Procedures:  ETT 3/15 >   Significant Diagnostic Tests:  CT head 3/14 > evolving posterior L MCA infarct, no hemorrhage, additional remote lacunar infarcts at the left thalamus and left cerebellum. CTA head / neck 3/14 >  CTP 3/14 >  MRI brain 3/14 >  CT head 3/15 >   Micro Data:  Sputum 3/15 >  RVP 3/15 >   Antimicrobials:  Unasyn 3/15 >    Interim history/subjective:  Just intubated by EDP.  Objective:  Blood pressure (!) 158/123, pulse 87, temperature 98.1 F (36.7 C), temperature source Oral, resp. rate (!) 31, height '5\' 9"'$  (1.753 m), weight (!) 152 kg, SpO2 93  %.       No intake or output data in the 24 hours ending 10/26/20 0131 Filed Weights   11/07/2020 2254  Weight: (!) 152 kg    Examination: General: Adult male, obese, encephalopathic. Neuro: Unresponsive, left gaze deviation. HEENT: /AT. Sclerae anicteric.  Left gaze deviation. Cardiovascular: RRR, no M/R/G.  Lungs: Respirations shallow and labored.  CTA bilaterally, No W/R/R.  Abdomen: Obese, BS x 4, soft, NT/ND.  Musculoskeletal: No gross deformities, no edema.  Skin: Intact, warm, no rashes.  Assessment & Plan:   Presumed L MCA infarct - s/p tPA.  Later had change in mental status, now with concern for hemorrhagic conversion. - Per neuro. - Follow up imaging per neuro.  HTN. - Cleviprex if needed for goal SBP < 180.  Acute hypoxic respiratory failure with presumed aspiration event while lying flat - later required intubation mainly due to mental status change and inability to protect airway. - Full vent support. - SBT once mental status improves. - Start unasyn and check sputum cultures and PCT. - Follow CXR.  Hyponatremia. Hypokalemia. AKI - unclear baseline. - Continue NS. - 40 mEq K per tube. - Follow BMP.  Hyperglycemia. - SSI.  Tobacco and THC dependence. - Cessation counseling once extubated.  Best Practice (evaluated daily):  Diet: NPO. Pain/Anxiety/Delirium protocol (if indicated): Propofol gtt /  Fentanyl PRN. VAP protocol (if indicated): In place. DVT prophylaxis: SCD's. GI prophylaxis: PPI. Glucose control: SSI. Mobility: Bedrest. Disposition: ICU.  Goals of Care:  Last date of multidisciplinary goals of care discussion: None.  Family and staff present: None. Summary of discussion: None. Follow up goals of care discussion due: 3/21. Code Status:  Full.  Labs   CBC: Recent Labs  Lab 10/18/2020 2254 10/26/20 0006  WBC 15.9*  --   NEUTROABS 13.4*  --   HGB 11.8* 12.6*  HCT 35.6* 37.0*  MCV 77.7*  --   PLT 476*  --    Basic Metabolic  Panel: Recent Labs  Lab 11/10/2020 2254 10/26/20 0006  NA 127* 129*  K 3.4* 3.3*  CL 93* 93*  CO2 23  --   GLUCOSE 289* 284*  BUN 41* 40*  CREATININE 2.00* 2.00*  CALCIUM 7.8*  --    GFR: Estimated Creatinine Clearance: 73.1 mL/min (A) (by C-G formula based on SCr of 2 mg/dL (H)). Recent Labs  Lab 11/11/2020 2254  WBC 15.9*   Liver Function Tests: Recent Labs  Lab 10/22/2020 2254  AST 40  ALT 66*  ALKPHOS 207*  BILITOT 1.2  PROT 7.0  ALBUMIN 1.5*   No results for input(s): LIPASE, AMYLASE in the last 168 hours. No results for input(s): AMMONIA in the last 168 hours. ABG    Component Value Date/Time   TCO2 23 10/26/2020 0006    Coagulation Profile: Recent Labs  Lab 10/13/2020 2254  INR 1.4*   Cardiac Enzymes: No results for input(s): CKTOTAL, CKMB, CKMBINDEX, TROPONINI in the last 168 hours. HbA1C: No results found for: HGBA1C CBG: Recent Labs  Lab 11/03/2020 2245  GLUCAP 281*    Review of Systems:   Unable to obtain as pt is encephalopathic.  Past medical history  He,  has a past medical history of Hypertension and Stroke ([place]).   Surgical History   History reviewed. No pertinent surgical history.   Social History   reports that he has never smoked. He has never used smokeless tobacco. He reports that he does not drink alcohol and does not use drugs.   Family history   His family history is not on file.   Allergies No Known Allergies   Home meds  Prior to Admission medications   Not on File    Critical care time: 35 min.    Montey Hora, [facility] For pager details, please see AMION If no response to pager, please call [phone] until 7:00 PM After 7:00 PM, please call Elink at [phone] 10/26/2020, 1:31 AM

## 2020-10-26 NOTE — ED Notes (Signed)
Patient transported to CT 

## 2020-10-26 NOTE — Progress Notes (Addendum)
STROKE TEAM PROGRESS NOTE   INTERVAL HISTORY Overnight he had sudden change in mentation. He had been talking to [facility] team when he became short of breath, poorly responsive and no longer keeping his head straight up. Noted by Neurologist- "On my very limited exam before he was given sedation and paralytic. He was not responsive to voice, eyes were midline and 54m BL, equal round and reactive to light. He seemed to be moving LUE and LLE more so than RUE. Dr. HDina Richdid notify me that he had some blood on the left side of his tongue concerning for potential tongue bite." He was sedated and intubated.  Around 4 AM nurse contacted Neurologist over exam change. It was noted - "R pupil is about 7278mand left is about 78m33mBoth pupils are not reactive to bright light. He does have a cough, a gag, corneals are positive and dolls eyes are negative. He did not have any spontaneous movement in any extremities. He did have facial grimace to nares stimulation and the grimace was symmetric." Stat CT showed - Acute left frontal parietal infarct. No hemorrhagic conversion or detected progression.  He is currently intubated. Minimally responsive with only minimal movement of Right Foot to noxious stimuli.  Vitals:   10/26/20 1115 10/26/20 1119 10/26/20 1130 10/26/20 1145  BP: (!) 165/116  (!) 165/115 (!) 155/98  Pulse: 90  91 93  Resp: (!) 22  (!) 21 (!) 32  Temp:      TempSrc:      SpO2: 98% 100% 96% 95%  Weight:      Height:       CBC:  Recent Labs  Lab 10/21/2020 2254 10/26/20 0006 10/26/20 0203  WBC 15.9*  --   --   NEUTROABS 13.4*  --   --   HGB 11.8* 12.6* 12.9*  HCT 35.6* 37.0* 38.0*  MCV 77.7*  --   --   PLT 476*  --   --    Basic Metabolic Panel:  Recent Labs  Lab 11/01/2020 2254 10/26/20 0006 10/26/20 0203  NA 127* 129* 129*  K 3.4* 3.3* 3.6  CL 93* 93*  --   CO2 23  --   --   GLUCOSE 289* 284*  --   BUN 41* 40*  --   CREATININE 2.00* 2.00*  --   CALCIUM 7.8*  --   --    Lipid  Panel:  Recent Labs  Lab 10/26/20 0708  CHOL 101  TRIG 127  HDL 24*  CHOLHDL 4.2  VLDL 25  LDLCALC 52   HgbA1c:  Recent Labs  Lab 10/26/20 0045  HGBA1C 8.3*   Urine Drug Screen:  Recent Labs  Lab 10/26/20 0849  LABOPIA NONE DETECTED  COCAINSCRNUR NONE DETECTED  LABBENZ NONE DETECTED  AMPHETMU NONE DETECTED  THCU POSITIVE*  LABBARB NONE DETECTED    Alcohol Level  Recent Labs  Lab 10/24/2020 2254  ETH <10    IMAGING past 24 hours CT HEAD WO CONTRAST  Result Date: 10/26/2020 CLINICAL DATA:  Cerebral hemorrhage suspected.  Unresponsive EXAM: CT HEAD WITHOUT CONTRAST TECHNIQUE: Contiguous axial images were obtained from the base of the skull through the vertex without intravenous contrast. COMPARISON:  Earlier today. FINDINGS: Brain: Cytotoxic edema in the left frontal parietal cortex. No acute hemorrhage, hydrocephalus, or visible progression. Vascular: No interval vessel hyperdensity. Skull: Normal. Negative for fracture or focal lesion. Sinuses/Orbits: No acute finding. IMPRESSION: Acute left frontal parietal infarct. No hemorrhagic conversion or detected progression. Electronically  Signed   By: Monte Fantasia M.D.   On: 10/26/2020 04:18   CT HEAD WO CONTRAST  Result Date: 10/26/2020 CLINICAL DATA:  stroke TPA was given and went into respiratory distress and had a seizure EXAM: CT HEAD WITHOUT CONTRAST TECHNIQUE: Contiguous axial images were obtained from the base of the skull through the vertex without intravenous contrast. COMPARISON:  CT angio head 10/26/2020 FINDINGS: Brain: Redemonstration of left parietal acute infarction. No evidence of large-territorial acute infarction. No parenchymal hemorrhage. No mass lesion. No extra-axial collection. No mass effect or midline shift. No hydrocephalus. Basilar cisterns are patent. Vascular: No hyperdense vessel. Skull: No acute fracture or focal lesion. Sinuses/Orbits: Paranasal sinuses and mastoid air cells are clear. The orbits  are unremarkable. Other: Endotracheal tube and enteric tube partially noted. IMPRESSION: 1. Similar-appearing acute left parietal infarction. 2. No acute intracranial hemorrhage. Electronically Signed   By: Iven Finn M.D.   On: 10/26/2020 02:04   MR BRAIN WO CONTRAST  Result Date: 10/26/2020 CLINICAL DATA:  Acute stroke EXAM: MRI HEAD WITHOUT CONTRAST TECHNIQUE: Multiplanar, multiecho pulse sequences of the brain and surrounding structures were obtained without intravenous contrast. COMPARISON:  CT and CTA from earlier the same day FINDINGS: Brain: Acute infarct involving the upper left cerebellum, bilateral ventral midbrain extending into the medial thalami, and along the left frontal parietal cortex. Petechial blood products at the left parietal cortex. Remote perforator infarct with chronic blood products at the left thalamus. Chronic micro hemorrhages in the brainstem and deep gray nuclei. Vascular: Preserved flow voids Skull and upper cervical spine: Normal marrow signal Sinuses/Orbits: Negative IMPRESSION: 1. Acute infarcts in the left left superior cerebellum, artery of [place] territory, and left frontal parietal cortex. 2. Petechial hemorrhage in the left parietal region. 3. Remote hypertensive pattern hemorrhages. Electronically Signed   By: Monte Fantasia M.D.   On: 10/26/2020 06:58   CT CEREBRAL PERFUSION W CONTRAST  Result Date: 10/26/2020 CLINICAL DATA:  Initial evaluation for acute stroke. EXAM: CT ANGIOGRAPHY HEAD AND NECK CT PERFUSION BRAIN TECHNIQUE: Multidetector CT imaging of the head and neck was performed using the standard protocol during bolus administration of intravenous contrast. Multiplanar CT image reconstructions and MIPs were obtained to evaluate the vascular anatomy. Carotid stenosis measurements (when applicable) are obtained utilizing NASCET criteria, using the distal internal carotid diameter as the denominator. Multiphase CT imaging of the brain was performed  following IV bolus contrast injection. Subsequent parametric perfusion maps were calculated using RAPID software. CONTRAST:  157m OMNIPAQUE IOHEXOL 350 MG/ML SOLN COMPARISON:  None. FINDINGS: CTA NECK FINDINGS Aortic arch: Visualized aortic arch normal in caliber with normal branch pattern. No hemodynamically significant stenosis seen about the origin of the great vessels. Partially visualized subclavian arteries patent without stenosis. Right carotid system: Visualized right common and internal carotid arteries patent without stenosis, dissection, or occlusion. Left carotid system: Visualized left common and internal carotid arteries patent without stenosis, dissection or occlusion. Vertebral arteries: Evaluation of the vertebral arteries somewhat limited by timing the contrast bolus and body habitus. Visualized portions patent without stenosis, dissection or occlusion. Skeleton: No visible acute osseous finding. No discrete lytic or blastic osseous lesions. Other neck: No other acute soft tissue abnormality within the neck. No mass or adenopathy. Upper chest: Extensive consolidated opacity seen within the visualized right upper lobe. Additional patchy consolidative opacity partially visualized within the peripheral left upper lobe. Findings concerning for multifocal pneumonia. Review of the MIP images confirms the above findings CTA HEAD FINDINGS Anterior circulation: Petrous  segments patent bilaterally. Mild atheromatous change within the carotid siphons without hemodynamically significant stenosis. A1 segments patent bilaterally. Right A1 slightly hypoplastic. Normal anterior communicating artery complex. Anterior cerebral arteries patent to their distal aspects without stenosis. No M1 stenosis or occlusion. Normal MCA bifurcations. No visible proximal MCA branch occlusion. Distal MCA branches well perfused and symmetric. Posterior circulation: Both V4 segments patent to the vertebrobasilar junction without  stenosis. Both PICA origins patent and normal. Basilar patent to its distal aspect without stenosis. Superior cerebellar arteries patent bilaterally. Both PCAs primarily supplied via the basilar and are grossly perfused to their distal aspects without stenosis. Venous sinuses: Grossly patent allowing for timing the contrast bolus. Anatomic variants: None significant.  No aneurysm. Review of the MIP images confirms the above findings CT Brain Perfusion Findings: ASPECTS: 9 CBF (<30%) Volume: 24m Perfusion (Tmax>6.0s) volume: 1242mMismatch Volume: 12061mnfarction Location:8 cc area of acute core infarct at the posterior left parietal lobe, corresponding with hypodensity on prior noncontrast head CT. Apparent scattered delayed perfusion throughout the bilateral cerebral and cerebellar hemispheres is felt to largely be artifactual in nature given the wide distribution. No definite surrounding penumbra seen about the acute core infarct itself. IMPRESSION: 1. Negative CTA for emergent large vessel occlusion. 2. 8 cc area of acute core infarct at the posterior left parietal lobe, corresponding with hypodensity on prior noncontrast head CT. Apparent delayed perfusion seen elsewhere throughout both cerebral and cerebellar hemispheres favored to in large part be artifactual. No definite surrounding ischemic penumbra. 3. Extensive consolidative airspace disease within the partially visualized right greater than left upper lobes, concerning for multifocal pneumonia. Critical Value/emergent results were called by telephone at the time of interpretation on 10/26/2020 at 12:08 am to provider SALNorcap Lodgewho verbally acknowledged these results. Electronically Signed   By: BenJeannine BogaD.   On: 10/26/2020 00:48   DG CHEST PORT 1 VIEW  Result Date: 10/26/2020 CLINICAL DATA:  Possible aspiration EXAM: PORTABLE CHEST 1 VIEW COMPARISON:  10/26/2020 at 0451 hours FINDINGS: Endotracheal tube terminates 4.0 cm above  the carina. Enteric tube courses below the diaphragm. Stable cardiomediastinal contours. Persistent airspace opacity throughout both lungs, right worse than left. Deep left sulcus is again seen. No definite pleural line. IMPRESSION: 1. No definite pneumothorax is seen. Continued attention on follow-up. 2. Persistent airspace opacity throughout both lungs, right worse than left. Electronically Signed   By: NicDavina PokeO.   On: 10/26/2020 08:17   Portable Chest xray  Addendum Date: 10/26/2020   ADDENDUM REPORT: 10/26/2020 05:56 ADDENDUM: These results were called by telephone at the time of interpretation on 10/26/2020 at 5:55 am to provider Nurse MegJinny Blossomho verbally acknowledged these results. Electronically Signed   By: MorIven FinnD.   On: 10/26/2020 05:56   Result Date: 10/26/2020 CLINICAL DATA:  Respiratory failure. EXAM: PORTABLE CHEST 1 VIEW COMPARISON:  chest x-ray 10/26/2020 1:50 a.m. FINDINGS: Endotracheal tube terminates 6 cm above the carina. Enteric tube courses below the hemidiaphragm. Enlarged cardiac silhouette. The heart size and mediastinal contours are unchanged. Persistent diffuse patchy airspace opacity that are most prominent along the hila/centrally. No pleural effusion. Question left deep sulcal sign and possible apical pleural line. No right pneumothorax. No acute osseous abnormality. IMPRESSION: 1. Question possible small left pneumothorax. Recommend repeat chest x-ray for further evaluation. Persistent diffuse patchy airspace opacity most consistent with pulmonary edema. Superimposed infection/inflammation not excluded. Electronically Signed: By: MorIven FinnD. On: 10/26/2020 05:49   [facility]  [facility]  Result Date: 10/26/2020 CLINICAL DATA:  Sudden onset right arm numbness, CVA status post tPA administration EXAM: PORTABLE CHEST 1 VIEW COMPARISON:  10/28/2005 FINDINGS: Single frontal view of the chest demonstrates endotracheal tube overlying tracheal air column  tip at thoracic inlet. Enteric catheter passes below diaphragm, coiled over the gastric fundus. The tip is excluded by collimation. Cardiac silhouette is enlarged. Bilateral airspace disease without effusion or pneumothorax. No acute bony abnormalities. IMPRESSION: 1. Unremarkable support devices as above. 2. Cardiomegaly and bilateral airspace disease, most consistent with pulmonary edema. Electronically Signed   By: Randa Ngo M.D.   On: 10/26/2020 02:24   DG Abd Portable 1V  Result Date: 10/26/2020 CLINICAL DATA:  OG tube placement EXAM: PORTABLE ABDOMEN - 1 VIEW COMPARISON:  None. FINDINGS: OG tube in place with the tip in the distal stomach or proximal duodenum. Nonobstructive bowel gas pattern. IMPRESSION: OG tube tip in the distal stomach or proximal duodenum. Electronically Signed   By: Rolm Baptise M.D.   On: 10/26/2020 11:00   ECHOCARDIOGRAM COMPLETE BUBBLE STUDY  Result Date: 10/26/2020    ECHOCARDIOGRAM REPORT   Patient Name:   Jesus Osborne Date of Exam: 10/26/2020 Medical Rec #:  [id number]     Height:       70.0 in Accession #:    [id number]    Weight:       335.1 lb Date of Birth:  Oct 07, 1980      BSA:          2.599 m Patient Age:    27 years      BP:           127/86 mmHg Patient Gender: M             HR:           93 bpm. Exam Location:  Inpatient Procedure: 2D Echo, Cardiac Doppler, Color Doppler and Saline Contrast Bubble            Study Indications:    Stroke  History:        Patient has no prior history of Echocardiogram examinations.                 Stroke; Risk Factors:Hypertension.  Sonographer:    Bernadene Person RDCS Referring Phys: [id number] [place]  1. There is a large hyperechoic mass (20 x 7 mm) attached to the inferior wall of the left ventricle, towards the apex. It is attached with a narrow base and is moderately mobile. It has increased echogenicity compared to the myocardium. Most likely this represents a thrombus, but cannot entirely exclude a  neoplasm. Left ventricular ejection fraction, by estimation, is 40%. The left ventricle has moderately decreased function. The left ventricle demonstrates global hypokinesis. There is mild concentric left ventricular hypertrophy. Left ventricular diastolic parameters are consistent with Grade II diastolic dysfunction (pseudonormalization). Elevated left atrial pressure.  2. Right ventricular systolic function is normal. The right ventricular size is normal. There is normal pulmonary artery systolic pressure.  3. Left atrial size was mildly dilated.  4. Right atrial size was mildly dilated.  5. The mitral valve is normal in structure. No evidence of mitral valve regurgitation.  6. The aortic valve is tricuspid. Aortic valve regurgitation is not visualized. No aortic stenosis is present.  7. The inferior vena cava is dilated in size with <50% respiratory variability, suggesting right atrial pressure of 15 mmHg.  8. Agitated saline contrast bubble study was negative, with  no evidence of any interatrial shunt. FINDINGS  Left Ventricle: There is a large hyperechoic mass (20 x 7 mm) attached to the inferior wall of the left ventricle, towards the apex. It is attached with a narrow base and is moderately mobile. It has increased echogenicity compared to the myocardium. Most likely this represents a thrombus, but cannot entirely exclude a neoplasm. Left ventricular ejection fraction, by estimation, is 40%. The left ventricle has moderately decreased function. The left ventricle demonstrates global hypokinesis. The left ventricular internal cavity size was normal in size. There is mild concentric left ventricular hypertrophy. Left ventricular diastolic parameters are consistent with Grade II diastolic dysfunction (pseudonormalization). Elevated left atrial pressure. Right Ventricle: The right ventricular size is normal. No increase in right ventricular wall thickness. Right ventricular systolic function is normal. There is  normal pulmonary artery systolic pressure. The tricuspid regurgitant velocity is 2.10 m/s, and  with an assumed right atrial pressure of 15 mmHg, the estimated right ventricular systolic pressure is [id number] mmHg. Left Atrium: Left atrial size was mildly dilated. Right Atrium: Right atrial size was mildly dilated. Pericardium: There is no evidence of pericardial effusion. Mitral Valve: The mitral valve is normal in structure. No evidence of mitral valve regurgitation. Tricuspid Valve: The tricuspid valve is normal in structure. Tricuspid valve regurgitation is mild. Aortic Valve: The aortic valve is tricuspid. Aortic valve regurgitation is not visualized. No aortic stenosis is present. Pulmonic Valve: The pulmonic valve was normal in structure. Pulmonic valve regurgitation is trivial. Aorta: The aortic root and ascending aorta are structurally normal, with no evidence of dilitation. Venous: The inferior vena cava is dilated in size with less than 50% respiratory variability, suggesting right atrial pressure of 15 mmHg. IAS/Shunts: No atrial level shunt detected by color flow Doppler. Agitated saline contrast was given intravenously to evaluate for intracardiac shunting. Agitated saline contrast bubble study was negative, with no evidence of any interatrial shunt.  LEFT VENTRICLE PLAX 2D LVIDd:         5.80 cm      Diastology LVIDs:         4.50 cm      LV e' medial:    4.68 cm/s LV PW:         1.20 cm      LV E/e' medial:  19.7 LV IVS:        1.20 cm      LV e' lateral:   6.93 cm/s LVOT diam:     2.30 cm      LV E/e' lateral: 13.3 LV SV:         71 LV SV Index:   27 LVOT Area:     4.15 cm  LV Volumes (MOD) LV vol d, MOD A2C: 127.0 ml LV vol d, MOD A4C: 203.0 ml LV vol s, MOD A2C: 75.3 ml LV vol s, MOD A4C: 119.0 ml LV SV MOD A2C:     51.7 ml LV SV MOD A4C:     203.0 ml LV SV MOD BP:      66.5 ml RIGHT VENTRICLE RV S prime:     13.80 cm/s TAPSE (M-mode): 1.5 cm LEFT ATRIUM             Index       RIGHT ATRIUM            Index LA diam:        4.30 cm 1.65 cm/m  RA Area:     21.70 cm LA Vol (  A2C):   63.8 ml 24.55 ml/m RA Volume:   62.00 ml  23.85 ml/m LA Vol (A4C):   67.5 ml 25.97 ml/m LA Biplane Vol: 66.2 ml 25.47 ml/m  AORTIC VALVE LVOT Vmax:   110.00 cm/s LVOT Vmean:  83.500 cm/s LVOT VTI:    0.170 m  AORTA Ao Root diam: 3.70 cm Ao Asc diam:  4.00 cm MITRAL VALVE               TRICUSPID VALVE MV Area (PHT): 4.06 cm    TR Peak grad:   17.6 mmHg MV Decel Time: 187 msec    TR Vmax:        210.00 cm/s MV E velocity: 92.20 cm/s MV A velocity: 57.20 cm/s  SHUNTS MV E/A ratio:  1.61        Systemic VTI:  0.17 m                            Systemic Diam: 2.30 cm Dani Gobble Croitoru MD Electronically signed by Sanda Klein MD Signature Date/Time: 10/26/2020/11:06:15 AM    Final    CT HEAD CODE STROKE WO CONTRAST  Result Date: 11/06/2020 CLINICAL DATA:  Code stroke. Initial evaluation for acute neuro deficit, stroke suspected. EXAM: CT HEAD WITHOUT CONTRAST TECHNIQUE: Contiguous axial images were obtained from the base of the skull through the vertex without intravenous contrast. COMPARISON:  Prior head CT from 10/28/2005 FINDINGS: Brain: Cerebral volume within normal limits. Vague wedge-shaped hypodensity seen involving the high posterior left parietal lobe, postcentral gyrus, consistent with an acute posterior left MCA distribution infarct (series 2, image 25). No associated hemorrhage or mass effect. No other acute large vessel territory infarct or hemorrhage elsewhere within the brain. Small remote lacunar infarcts present at the left thalamus and left cerebellum. Focal prominence of the extra-axial space at the bilateral occipital lobes could reflect areas of focal encephalomalacia related to prior ischemia or possibly focal prominent cortical sulcation. No mass lesion, midline shift or mass effect. No hydrocephalus or extra-axial fluid collection. Vascular: No appreciable hyperdense vessel. Skull: Scalp soft tissues and  calvarium within normal limits. Sinuses/Orbits: Globes and orbital soft tissues within normal limits. Paranasal sinuses are clear. No mastoid effusion. Other: None. ASPECTS [facility] Stroke Program Early CT Score) - Ganglionic level infarction (caudate, lentiform nuclei, internal capsule, insula, M1-M3 cortex): 7 - Supraganglionic infarction (M4-M6 cortex): 2 Total score (0-10 with 10 being normal): 9 IMPRESSION: 1. Wedge-shaped hypodensity at the posterior left parietal lobe, consistent with an evolving posterior left MCA distribution infarct. No hemorrhage. 2. ASPECTS is 9. 3. Additional remote lacunar infarcts at the left thalamus and left cerebellum. 4. Focal prominence of the extra-axial space at the bilateral occipital lobes, indeterminate, but could reflect encephalomalacia related to prior ischemia or other insult versus focally prominent cortical sulcation. Critical Value/emergent results were called by telephone at the time of interpretation on 10/13/2020 at 11:25 pm to provider Dr. Lorrin Goodell. Electronically Signed   By: Jeannine Boga M.D.   On: 11/07/2020 23:38   CT ANGIO HEAD CODE STROKE  Result Date: 10/26/2020 CLINICAL DATA:  Initial evaluation for acute stroke. EXAM: CT ANGIOGRAPHY HEAD AND NECK CT PERFUSION BRAIN TECHNIQUE: Multidetector CT imaging of the head and neck was performed using the standard protocol during bolus administration of intravenous contrast. Multiplanar CT image reconstructions and MIPs were obtained to evaluate the vascular anatomy. Carotid stenosis measurements (when applicable) are obtained utilizing NASCET criteria, using the distal internal  carotid diameter as the denominator. Multiphase CT imaging of the brain was performed following IV bolus contrast injection. Subsequent parametric perfusion maps were calculated using RAPID software. CONTRAST:  147m OMNIPAQUE IOHEXOL 350 MG/ML SOLN COMPARISON:  None. FINDINGS: CTA NECK FINDINGS Aortic arch: Visualized aortic  arch normal in caliber with normal branch pattern. No hemodynamically significant stenosis seen about the origin of the great vessels. Partially visualized subclavian arteries patent without stenosis. Right carotid system: Visualized right common and internal carotid arteries patent without stenosis, dissection, or occlusion. Left carotid system: Visualized left common and internal carotid arteries patent without stenosis, dissection or occlusion. Vertebral arteries: Evaluation of the vertebral arteries somewhat limited by timing the contrast bolus and body habitus. Visualized portions patent without stenosis, dissection or occlusion. Skeleton: No visible acute osseous finding. No discrete lytic or blastic osseous lesions. Other neck: No other acute soft tissue abnormality within the neck. No mass or adenopathy. Upper chest: Extensive consolidated opacity seen within the visualized right upper lobe. Additional patchy consolidative opacity partially visualized within the peripheral left upper lobe. Findings concerning for multifocal pneumonia. Review of the MIP images confirms the above findings CTA HEAD FINDINGS Anterior circulation: Petrous segments patent bilaterally. Mild atheromatous change within the carotid siphons without hemodynamically significant stenosis. A1 segments patent bilaterally. Right A1 slightly hypoplastic. Normal anterior communicating artery complex. Anterior cerebral arteries patent to their distal aspects without stenosis. No M1 stenosis or occlusion. Normal MCA bifurcations. No visible proximal MCA branch occlusion. Distal MCA branches well perfused and symmetric. Posterior circulation: Both V4 segments patent to the vertebrobasilar junction without stenosis. Both PICA origins patent and normal. Basilar patent to its distal aspect without stenosis. Superior cerebellar arteries patent bilaterally. Both PCAs primarily supplied via the basilar and are grossly perfused to their distal aspects  without stenosis. Venous sinuses: Grossly patent allowing for timing the contrast bolus. Anatomic variants: None significant.  No aneurysm. Review of the MIP images confirms the above findings CT Brain Perfusion Findings: ASPECTS: 9 CBF (<30%) Volume: 856mPerfusion (Tmax>6.0s) volume: 12812mismatch Volume: 120m62mfarction Location:8 cc area of acute core infarct at the posterior left parietal lobe, corresponding with hypodensity on prior noncontrast head CT. Apparent scattered delayed perfusion throughout the bilateral cerebral and cerebellar hemispheres is felt to largely be artifactual in nature given the wide distribution. No definite surrounding penumbra seen about the acute core infarct itself. IMPRESSION: 1. Negative CTA for emergent large vessel occlusion. 2. 8 cc area of acute core infarct at the posterior left parietal lobe, corresponding with hypodensity on prior noncontrast head CT. Apparent delayed perfusion seen elsewhere throughout both cerebral and cerebellar hemispheres favored to in large part be artifactual. No definite surrounding ischemic penumbra. 3. Extensive consolidative airspace disease within the partially visualized right greater than left upper lobes, concerning for multifocal pneumonia. Critical Value/emergent results were called by telephone at the time of interpretation on 10/26/2020 at 12:08 am to provider [facility] verbally acknowledged these results. Electronically Signed   By: BenjJeannine Boga.   On: 10/26/2020 00:48   CT ANGIO NECK CODE STROKE  Result Date: 10/26/2020 CLINICAL DATA:  Initial evaluation for acute stroke. EXAM: CT ANGIOGRAPHY HEAD AND NECK CT PERFUSION BRAIN TECHNIQUE: Multidetector CT imaging of the head and neck was performed using the standard protocol during bolus administration of intravenous contrast. Multiplanar CT image reconstructions and MIPs were obtained to evaluate the vascular anatomy. Carotid stenosis measurements (when  applicable) are obtained utilizing NASCET criteria, using the distal internal  carotid diameter as the denominator. Multiphase CT imaging of the brain was performed following IV bolus contrast injection. Subsequent parametric perfusion maps were calculated using RAPID software. CONTRAST:  127m OMNIPAQUE IOHEXOL 350 MG/ML SOLN COMPARISON:  None. FINDINGS: CTA NECK FINDINGS Aortic arch: Visualized aortic arch normal in caliber with normal branch pattern. No hemodynamically significant stenosis seen about the origin of the great vessels. Partially visualized subclavian arteries patent without stenosis. Right carotid system: Visualized right common and internal carotid arteries patent without stenosis, dissection, or occlusion. Left carotid system: Visualized left common and internal carotid arteries patent without stenosis, dissection or occlusion. Vertebral arteries: Evaluation of the vertebral arteries somewhat limited by timing the contrast bolus and body habitus. Visualized portions patent without stenosis, dissection or occlusion. Skeleton: No visible acute osseous finding. No discrete lytic or blastic osseous lesions. Other neck: No other acute soft tissue abnormality within the neck. No mass or adenopathy. Upper chest: Extensive consolidated opacity seen within the visualized right upper lobe. Additional patchy consolidative opacity partially visualized within the peripheral left upper lobe. Findings concerning for multifocal pneumonia. Review of the MIP images confirms the above findings CTA HEAD FINDINGS Anterior circulation: Petrous segments patent bilaterally. Mild atheromatous change within the carotid siphons without hemodynamically significant stenosis. A1 segments patent bilaterally. Right A1 slightly hypoplastic. Normal anterior communicating artery complex. Anterior cerebral arteries patent to their distal aspects without stenosis. No M1 stenosis or occlusion. Normal MCA bifurcations. No visible  proximal MCA branch occlusion. Distal MCA branches well perfused and symmetric. Posterior circulation: Both V4 segments patent to the vertebrobasilar junction without stenosis. Both PICA origins patent and normal. Basilar patent to its distal aspect without stenosis. Superior cerebellar arteries patent bilaterally. Both PCAs primarily supplied via the basilar and are grossly perfused to their distal aspects without stenosis. Venous sinuses: Grossly patent allowing for timing the contrast bolus. Anatomic variants: None significant.  No aneurysm. Review of the MIP images confirms the above findings CT Brain Perfusion Findings: ASPECTS: 9 CBF (<30%) Volume: 815mPerfusion (Tmax>6.0s) volume: 12818mismatch Volume: 120m36mfarction Location:8 cc area of acute core infarct at the posterior left parietal lobe, corresponding with hypodensity on prior noncontrast head CT. Apparent scattered delayed perfusion throughout the bilateral cerebral and cerebellar hemispheres is felt to largely be artifactual in nature given the wide distribution. No definite surrounding penumbra seen about the acute core infarct itself. IMPRESSION: 1. Negative CTA for emergent large vessel occlusion. 2. 8 cc area of acute core infarct at the posterior left parietal lobe, corresponding with hypodensity on prior noncontrast head CT. Apparent delayed perfusion seen elsewhere throughout both cerebral and cerebellar hemispheres favored to in large part be artifactual. No definite surrounding ischemic penumbra. 3. Extensive consolidative airspace disease within the partially visualized right greater than left upper lobes, concerning for multifocal pneumonia. Critical Value/emergent results were called by telephone at the time of interpretation on 10/26/2020 at 12:08 am to provider [facility]MThe [facility] Yorkho verbally acknowledged these results. Electronically Signed   By: BenjJeannine Boga.   On: 10/26/2020 00:48    PHYSICAL EXAM Blood pressure (!)  155/98, pulse 93, temperature 98.1 F (36.7 C), temperature source Axillary, resp. rate (!) 32, height '5\' 10"'$  (1.778 m), weight (!) 152 kg, SpO2 95 %. Obese young African-American male General: Sedated and intubated, af[place], no apparent distress  Lungs: Symmetrical Chest rise, no labored breathing  Cardio: Regular Rate and Rhythm  Abdomen: Soft, non-tender  Neurological Exam ;: Currently sedated and intubated.  Eyes  are closed.  Mild skew eye deviation  Has Right hypertropia and Left Exotropia.  Pupils are small, minimally reactive to light.  Fundi could not be visualized Only has minimal movement in Right foot to Noxious stimuli. Slight left upper extremity movement to sternal rub Plantars both noticeable  ASSESSMENT/PLAN Jesus Osborne is a 40 y.o. male with history of HTN, prior stroke(about 7 years ago per patient) with mild R sided weakness, uses a cane to walk who presents with R arm numbness and worsening of his R sided weakness. He was sitting at his porch eating salad when he had sudden onset R sided weakness, mostly with his arm and also his leg. He is R handed. Writes with his R hand. Uses a cane but can walk around fine. Reports had a stroke 7 years ago with residual mild R sided weakness. Also endorses shortness of breath since yesterday which has gotten worse today.  Patient is currently intubated and minimally responsive. Will continue with Stroke Workup.   Left Superior Cerebellum, Artery of Percheron territory, and Left Frontal Parietal Cortex infarcts secondary to cardiogenic emboli left ventricular clot versus mass   code Stroke CT head - Wedge-shaped hypodensity at the posterior left parietal lobe, consistent with an evolving posterior left MCA distribution infarct. No hemorrhage. ASPECTS is 9. Additional remote lacunar infarcts at the left thalamus and left cerebellum. Focal prominence of the extra-axial space at the bilateral occipital lobes,  indeterminate, but could reflect encephalomalacia related to prior ischemia or other insult versus focally prominent cortical sulcation.    CT Head WO Contrast 0220- Similar-appearing acute left parietal infarction. No acute intracranial hemorrhage.  Repeat CT Head WO Contrast 0420 - Acute left frontal parietal infarct. No hemorrhagic conversion or detected progression.  CT perfusion - Negative CTA for emergent large vessel occlusion. 8 cc area of acute core infarct at the posterior left parietal lobe, corresponding with hypodensity on prior noncontrast head CT. Apparent delayed perfusion seen elsewhere throughout both cerebral and cerebellar hemispheres favored to in large part be artifactual. No definite surrounding ischemic penumbra. Extensive consolidative airspace disease within the partially visualized right greater than left upper lobes, concerning for multifocal pneumonia.  MRI - Acute infarcts in the left superior cerebellum, artery of percheron territory, and left frontal parietal cortex. Petechial hemorrhage in the left parietal region. Remote hypertensive pattern hemorrhages.  2D Echo - EF: 40% There is a large hyperechoic mass (20 x 7 mm) attached to the inferior wall of the left ventricle, towards the apex. It is attached with a narrow base and is moderately mobile. It has increased echogenicity compared to the myocardium. Most likely this represents a thrombus, but cannot entirely exclude a neoplasm.  EEG - This study is suggestive of moderate diffuse encephalopathy, nonspecific etiology. No seizures or epileptiform discharges were seen throughout the recording.   LDL 52  HgbA1c 8.3  VTE prophylaxis - SCD's    Diet   Diet NPO time specified     No antithrombotic prior to admission, now on Will start IV Heparin at 0000 3/16 due to receiving tPA at 0000 on 3/15.   Therapy recommendations:  Pending  Disposition:  Pending  Hypertension  Home meds:  None  Semi  Stable  Currently on Cleviprex   Currently has Labetalol PRN . Permissive hypertension (OK if < 220/120) but gradually normalize in 5-7 days . Long-term BP goal normotensive  Hyperlipidemia  Home meds:  none  LDL 52, goal < 70  Currently not  on any medication  Diabetes type II Uncontrolled  Home meds:  None  HgbA1c 8.3, goal < 7.0  CBGs Recent Labs    10/24/2020 2245 10/26/20 0731 10/26/20 1139  GLUCAP 281* 232* 147*      SSI  Other Stroke Risk Factors   Cigarette smoker will advise to stop smoking  Substance abuse - UDS:  THC POSITIVE, Cocaine NONE DETECTED. Patient will be advised to stop using due to stroke risk.  Obesity, Body mass index is 48.08 kg/m., BMI >/= 30 associated with increased stroke risk, recommend weight loss, diet and exercise as appropriate   Hx stroke (approximately 2015)  Other Active Problems  Right Lung Pneumonia- on [facility] day # 0  Fatima Sanger MD Resident I have personally obtained history,examined this patient, reviewed notes, independently viewed imaging studies, participated in medical decision making and plan of care.ROS completed by me personally and pertinent positives fully documented  I have made any additions or clarifications directly to the above note. Agree with note above.  He presented with increased right-sided weakness from his baseline and subsequently became unresponsive last night during intubation and shows embolic left superior cerebellar, bilateral medial thalamic and left frontal cortical infarcts likely of embolic etiology from left ventricular clot versus mass.  Prognosis is very guarded.  Received IV TPA.  Continue close neurological monitoring and strict blood pressure control as per post TPA protocol.  We will start IV heparin drip 24 hours post TPA no family available for discussion.  Ventilatory support for respiratory failure.  Discussed with Dr. Kipp Brood critical care medicine.  I called and  left a message on his listed patient contact Claiborne Billings [employer] at [phone].  This patient is critically ill and at significant risk of neurological worsening, death and care requires constant monitoring of vital signs, hemodynamics,respiratory and cardiac monitoring, extensive review of multiple databases, frequent neurological assessment, discussion with family, other specialists and medical decision making of high complexity.I have made any additions or clarifications directly to the above note.This critical care time does not reflect procedure time, or teaching time or supervisory time of PA/NP/Med Resident etc but could involve care discussion time.  I spent 40 minutes of neurocritical care time  in the care of  this patient.      Antony Contras, MD Medical Director [place] Pager: [phone] 10/26/2020 3:08 PM   To contact Stroke Continuity provider, please refer to [email or web address] After hours, contact General Neurology

## 2020-10-26 NOTE — Progress Notes (Addendum)
Brief Neuro Update:  Notified by RN about change in mentation. On discussion with RN, patient was talking to [facility] team. Was more short of breath, then noted to be poorly responsive with head leaning on his left shoulder and ?eyes deviated to his left. Not entirely sure if he crossed midline. No other obvious twitching or shaking concerning for a seizure.  On my very limited exam before he was given sedation and paralytic. He was not responsive to voice, eyes were midline and 22m BL, equal round and reactive to light. He seemed to be moving LUE and LLE more so than RUE. Dr. HDina Richdid notify me that he had some blood on the left side of his tongue concerning for potential tongue bite.   He was intubated in the ED. His Blood pressure did rise during intubation and he was immediately started on Cleviprex afterwards. A STAT CTH was obtained and I personally reviewed and it was negative for any acute abnormality.  Recs: - Reviewed STAT CTH which was negative for ICH - Keppra 3G IV once('20mg'$ /Kg) ordered - cEEG ordered and I attempted to notified tech on call for STAT cEEG hookup but was unable to get in touch. Will try again.  SBeloitPager Number [phone]

## 2020-10-26 NOTE — Progress Notes (Signed)
LTM started, no initial skin breakdown was seen. [facility] notified. No event button on computer to test.

## 2020-10-26 NOTE — Progress Notes (Signed)
Inpatient Diabetes Program Recommendations  AACE/ADA: New Consensus Statement on Inpatient Glycemic Control (2015)  Target Ranges:  Prepandial:   less than 140 mg/dL      Peak postprandial:   less than 180 mg/dL (1-2 hours)      Critically ill patients:  140 - 180 mg/dL   Lab Results  Component Value Date   GLUCAP 232 (H) 10/26/2020   HGBA1C 8.3 (H) 10/26/2020    Review of Glycemic Control Diabetes history: none  Current orders for Inpatient glycemic control:  Novolog 0-20 units Q4 hours  A1c 8.3% on 3/15  A1c indicates new diagnosis of Diabetes. Will follow pt and glucose trends while inpatient.  Thanks,  Tama Headings RN, MSN, BC-ADM Inpatient Diabetes Coordinator Team Pager [phone] (8a-5p)

## 2020-10-26 NOTE — Progress Notes (Addendum)
NAME:  Jesus Osborne, MRN:  [id number], DOB:  Mar 15, 1981, LOS: 0 ADMISSION DATE:  11/02/2020, CONSULTATION DATE:  10/26/20 REFERRING MD:  Lorrin Goodell, CHIEF COMPLAINT:  AMS   Brief History:  Jesus Osborne is a 40 y.o. male who was admitted 3/14 with CVA s/p tPA.  While in [place], had hypoxia requiring NRB then 2L O2 via [place].  Later had change in mental status with minimal response; therefore, intubated before returning to CT.  History of Present Illness:  Pt is encephelopathic; therefore, this HPI is obtained from chart review. Jesus Osborne is a 40 y.o. male who has a PMH including but not limited to prior CVA, HTN, THC and tobacco dependence (see "past medical history" for rest).  He presented to [facility] ED 3/14 with R arm numbness and R sided weakness.  He was taken to CT which was concerning for evolving L MCA infarct.  HE received tPA at 002 (slighly delay due to difficult IV access).  While in CT, he had desaturation requiring NRB and later placed on 2L O2 via [place]. While in ED, later had change in mental status around 0115 with left gaze deviation and decrease in alertness.  Decision made to intubate prior to taking back to CT for repeat scan.  Past Medical History:  has Acute ischemic left MCA stroke ([place]) on their problem list.  Significant Hospital Events:  3/14 > admit. 3/15 > intubated.  Consults:  PCCM  Procedures:  ETT 3/15>  Significant Diagnostic Tests:  -CT head 3/14 (11:22) > evolving posterior L MCA infarct, no hemorrhage, additional remote lacunar infarcts at the left thalamus and left cerebellum. -CTA head / neck 3/14 (12:01)> negative for emergent large vessel occlusoin -CTP 3/14 (12:01)> 8 cc area of acute core infarct at the posterior left parietal lobe -CT head 3/15 (01:56)> no acute intracranial hemorrhage -MRI brain 3/15 (06:40)>  1.Acute infarcts in the left left superior cerebellum, artery of percheron territory, and left frontal parietal cortex. 2. Petechial  hemorrhage in the left parietal region. 3. Remote hypertensive pattern hemorrhages.   Micro Data:  Sputum 3/15 >  RVP 3/15 >   Antimicrobials:  Unasyn 3/15 >    Interim History / Subjective:  Pt unresponsive to verbal or painful stimuli off propofol since ~3:30AM. Intermittent apparent non-purposeful movement of left arm and left toes.  Objective   Blood pressure (!) 129/93, pulse 92, temperature (!) 96.9 F (36.1 C), temperature source Axillary, resp. rate (!) 23, height '5\' 10"'$  (1.778 m), weight (!) 152 kg, SpO2 100 %.    Vent Mode: PRVC FiO2 (%):  [40 %-100 %] 40 % Set Rate:  [18 bmp] 18 bmp Vt Set:  [580 mL] 580 mL PEEP:  [5 cmH20] 5 cmH20 Plateau Pressure:  [14 cmH20-24 cmH20] 22 cmH20   Intake/Output Summary (Last 24 hours) at 10/26/2020 1138 Last data filed at 10/26/2020 1000 Gross per 24 hour  Intake 519.33 ml  Output 600 ml  Net -80.67 ml   Filed Weights   10/21/2020 2254  Weight: (!) 152 kg    Examination: General: obese male, intubated, unresponsive on no sedation HENT: vertical dysconjugate gaze; left pupil ~26m, right pupil ~250m not reactive to light, ET tube in place, MMM Lungs: mechanically ventilation, bilateral ronchi Cardiovascular: RRR, normal S1S2,  Abdomen: soft, non-distended, NABS Extremities: no peripheral edema, 2+ dorsalis pedis pulses, 1+ radial pulses Neuro: Unresponsive to verbal or noxious stimuli on no sedation, vertical dysconjugate gaze, + corneal reflex, - doll's  eye, - menace, intermittent non-purposeful movement of left upper extremities and left toes GU: foley in place with small amount of urine  Resolved Hospital Problem list     Assessment & Plan:   Left Frontal Parietal, Left Superior Cerebellar Infarction  s/p TPA Presumed embolic source given distribution of ischemic brain tissue. Injury to ventral midbrain extending into the medial thalamus likely contributing to unresponsive state. -consult neurosurgery for possible  decompression craniectomy -goal euthermia, euvolemia, normonatremia -avoid hypotension -correcting Na -given cerebellar infarct, young age, and hyponatremia increased risk for herniation -follow neuro status and BMP closely -start ASA 24 hours s/pTPA -f/u echo bubble study -f/u hypercoagulable panel  Acute hypoxic Respiratory Failure - Pt minimally responsive and unable to protect his airway - Maintain airway  Aspiration Pneumonia -bilateral infiltrates on CXR with elevated WBC and history of AMS suspicious for aspiration -procal 0.96 -cont Unasyn -f/u sputum culture  Hyponatremia Hypokalemia, Resolved -Na 129 early this AM -cont IV NS -f/u BMP this evening  AKI Underlying diabetes with proteinuria on urinalysis 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 Pain/Anxiety/Delirium protocol (if indicated): Propofol gtt / Fentanyl PRN. VAP protocol (if indicated): In place. DVT prophylaxis: SCD's. GI prophylaxis: PPI. Glucose control: SSI. Mobility: Bedrest. Disposition: ICU.  Goals of Care:  Last date of multidisciplinary goals of care discussion: None.  Family and staff present: None. Summary of discussion: None. Follow up goals of care discussion due: 3/21. Code Status:  Full.  Labs   CBC: Recent Labs  Lab 11/02/2020 2254 10/26/20 0006 10/26/20 0203  WBC 15.9*  --   --   NEUTROABS 13.4*  --   --   HGB 11.8* 12.6* 12.9*  HCT 35.6* 37.0* 38.0*  MCV 77.7*  --   --   PLT 476*  --   --     Basic Metabolic Panel: Recent Labs  Lab 11/08/2020 2254 10/26/20 0006 10/26/20 0203  NA 127* 129* 129*  K 3.4* 3.3* 3.6  CL 93* 93*  --   CO2 23  --   --   GLUCOSE 289* 284*  --   BUN 41* 40*  --   CREATININE 2.00*  2.00*  --   CALCIUM 7.8*  --   --    GFR: Estimated Creatinine Clearance: 74.1 mL/min (A) (by C-G formula based on SCr of 2 mg/dL (H)). Recent Labs  Lab 11/09/2020 2254 10/26/20 0117  PROCALCITON  --  0.96  WBC 15.9*  --     Liver Function Tests: Recent Labs  Lab 10/17/2020 2254  AST 40  ALT 66*  ALKPHOS 207*  BILITOT 1.2  PROT 7.0  ALBUMIN 1.5*   No results for input(s): LIPASE, AMYLASE in the last 168 hours. No results for input(s): AMMONIA in the last 168 hours.  ABG    Component Value Date/Time   PHART 7.369 10/26/2020 0203   PCO2ART 47.0 10/26/2020 0203   PO2ART 81 (L) 10/26/2020 0203   HCO3 27.1 10/26/2020 0203   TCO2 29 10/26/2020 0203   O2SAT 95.0 10/26/2020 0203     Coagulation Profile: Recent Labs  Lab 10/26/2020 2254  INR 1.4*    Cardiac Enzymes: No results for input(s): CKTOTAL, CKMB, CKMBINDEX, TROPONINI in the last 168 hours.  HbA1C: Hgb A1c  MFr Bld  Date/Time Value Ref Range Status  10/26/2020 12:45 AM 8.3 (H) 4.8 - 5.6 % Final    Comment:    (NOTE) Pre diabetes:          5.7%-6.4%  Diabetes:              >6.4%  Glycemic control for   <7.0% adults with diabetes     CBG: Recent Labs  Lab 11/10/2020 2245 10/26/20 0731  GLUCAP 281* 232*    Review of Systems:   Unable to assess given unresponsive state.  Past Medical History:  He,  has a past medical history of Hypertension and Stroke ([place]).   Surgical History:  History reviewed. No pertinent surgical history.   Social History:   reports that he has never smoked. He has never used smokeless tobacco. He reports that he does not drink alcohol and does not use drugs.   Family History:  His family history is not on file.   Allergies No Known Allergies   Home Medications  Prior to Admission medications   Not on [place], Medical Student 10/26/2020 , 11:38 AM

## 2020-10-26 NOTE — Progress Notes (Signed)
Patient transported from ED Room 30 to [id number] with no complications.

## 2020-10-26 NOTE — Progress Notes (Signed)
Transported pt from 4N Rm15 to CT and back to 4N Rm15. No complications noted

## 2020-10-26 NOTE — Progress Notes (Signed)
Patient transported from ED Room 30 to CT and back with no complications.

## 2020-10-26 NOTE — Progress Notes (Signed)
OT Cancellation Note  Patient Details Name: Jesus Osborne MRN: [id number] DOB: 06-14-1981   Cancelled Treatment:    Reason Eval/Treat Not Completed: Patient not medically ready  [place], OT/L   Acute OT Clinical Specialist Acute Rehabilitation Services Pager [phone] Office [phone]  10/26/2020, 6:59 AM

## 2020-10-26 NOTE — Progress Notes (Signed)
PT Cancellation Note  Patient Details Name: LISH TROUTMAN MRN: [id number] DOB: Aug 12, 1981   Cancelled Treatment:    Reason Eval/Treat Not Completed: Active bedrest order - will check back after neuro rounds.  Stacie Glaze, PT Acute Rehabilitation Services Pager [phone]  Office [phone]    Louis Matte 10/26/2020, 9:10 AM

## 2020-10-26 NOTE — Progress Notes (Signed)
Pharmacy Antibiotic Note  Jesus Osborne is a 40 y.o. male admitted on 11/03/2020 with acute ischemic stroke.  Pharmacy has been consulted for Unasyn dosing for aspiration pneumonia. WBC is elevated. Noted renal dysfunction.   Plan: Unasyn 3g IV q8h Trend WBC, temp, renal function  F/U infectious work-up   Height: '5\' 10"'$  (177.8 cm) Weight: (!) 152 kg (335 lb 1.6 oz) IBW/kg (Calculated) : 73  Temp (24hrs), Avg:98.2 F (36.8 C), Min:98.1 F (36.7 C), Max:98.3 F (36.8 C)  Recent Labs  Lab 11/08/2020 2254 10/26/20 0006  WBC 15.9*  --   CREATININE 2.00* 2.00*    Estimated Creatinine Clearance: 74.1 mL/min (A) (by C-G formula based on SCr of 2 mg/dL (H)).    No Known Allergies  Narda Bonds, PharmD, BCPS Clinical Pharmacist Phone: [phone]

## 2020-10-26 NOTE — Code Documentation (Signed)
Responded to Code Stroke called at [id number] for R arm numbness/weakness on pt already in triage, [phone]. NIH-4 for R sided weakness, R arm ataxia, and R sided sensory deficit, CBG-281, CT head-evolving L MCA distribution infarct, remote lacunar infarcts at the L thalamus and L cerebellum, no hemorrhage. Labetolol '10mg'$  IV given x 2 and cleviprex gtt started for BP control prior to hanging TPA. TPA started at [place] after delay d/t BP control and difficultly obtaining IV access. CTA/CTP-no LVO.  Plan to admit to ICU.

## 2020-10-26 NOTE — ED Notes (Signed)
Around this time, while speaking with Pulmonary Critical Care and having labs drawn by phlebotomy, Mr. Peltz began breathing very heavily, bobbing and weaving his head, and leaning his body and head to the left.  The pt appeared to this RN to have a left-sided gaze.  When this RN turned his head toward me, his eyes continued to gaze leftward.  Myself and Critical Care began asking the pt where he was and what was his name to no avail.  He would not respond with more than a grunt or a sigh.  After 30 sec to a minute of this, I reached out to the EDP and neurologist to inform them what was happening. They agreed, a repeat CT was appropriate and that intubation would be necessary to protect the pt's airway d/t these neurological changes.  At that time, I notified respiratory, my charge nurse and other members of my team that we would be intubating.

## 2020-10-26 NOTE — Progress Notes (Signed)
  Echocardiogram 2D Echocardiogram has been performed. Notified Dr. Sallyanne Kuster 10/26/20 at 10:42.  Jesus Osborne 10/26/2020, 10:42 AM

## 2020-10-26 NOTE — ED Notes (Signed)
Patient transported to X-ray 

## 2020-10-26 NOTE — Progress Notes (Signed)
[facility] Progress Note Patient Name: Jesus Osborne DOB: 08/23/1980 MRN: [id number]   Date of Service  10/26/2020  HPI/Events of Note  Patient admitted through the ED for right sided weakness r/o CVA and received TPA per protocol, subsequently with decline in mental status and lung infiltrates suggestive of aspiration, so he was intubated for acute respiratory failure, altered mental status and aspiration pneumonia. CT head at the time showed no indication of hemorrhagic conversion, however on arrival to the ICU patient suffered a further significant decline in mental status and is currently en-route to CT for a stat CT head to r/o ICH.  eICU Interventions  New Patient Evaluation  completed.        Kerry Kass Jesus Osborne 10/26/2020, 3:46 AM

## 2020-10-26 NOTE — Progress Notes (Signed)
SLP Cancellation Note  Patient Details Name: MIKIAS NEUBER MRN: [id number] DOB: August 28, 1980   Cancelled treatment:        On ventilator. Will follow for appropriateness for evaluations.    Houston Siren 10/26/2020, 8:20 AM   Orbie Pyo Colvin Caroli.Ed Risk analyst [phone] Office [phone]

## 2020-10-26 NOTE — Progress Notes (Signed)
Brief Neuro update:  Reviewed CTH. Notable for posterior fossa mass effect with partial effacement of the fourth ventricle. No evidence of obstructive hydrocephalus.  Recs: - Will start on Hypertonic Saline 3% at 59m/hr with goal Sodium of 150-155. - Will keep the repeat CTH w/o contrast at midnight. Reviewed notes from Dr. SLeonie Mantoday. Per notes, plan to start heparin gtt if no ICH on CTH at midnight.   SMonroePager Number [phone]

## 2020-10-26 NOTE — Progress Notes (Signed)
Pt belongings at bedside: cell phone, wallet, cane, clothing. Pt's girlfriend took home wallet after visiting with patient.

## 2020-10-26 NOTE — Progress Notes (Signed)
After ETT suctioning, patient noted with eyes rolled back in head, LUE and LLE with spontaneous non-purposeful movements against gravity. Patient with mouth clamped, biting down on tube, desatting to 40's. Patient bagged, Ativan given for ?seizure activity, RT and Dr. Leonel Ramsay paged. Patient's sats increased to 100%  Reconnected to vent at 100% Fio2, bite block placed. STAT Head CT ordered.

## 2020-10-26 NOTE — Progress Notes (Signed)
Transported pt to MRI from [place]. No complications noted

## 2020-10-26 NOTE — Progress Notes (Signed)
STAT EEG completed; results pending. Dr Yadav notified. 

## 2020-10-26 NOTE — Progress Notes (Signed)
Called regarding possible seizure activity. Initially, recommended '2mg'$  IV ativan pending my ability to review EEG. Once I did review EEG, my suspicion is more for a coughing episode, though there is at least once where he appears to posture. I would favor STAT CT head given the degree of involvement in the cerebellum.   Roland Rack, MD [facility] [phone]  If 7pm- 7am, please page neurology on call as listed in [place].

## 2020-10-26 NOTE — Procedures (Signed)
Patient Name: Jesus Osborne  MRN: [id number]  Epilepsy Attending: Lora Havens  Referring Physician/Provider: Dr. Clance Boll Date: 10/26/2020 Duration: 23.08 minutes  Patient history: 40 year old male with altered mental status.  EEG done for seizures.  Level of alertness: asleep  AEDs during EEG study: None  Technical aspects: This EEG study was done with scalp electrodes positioned according to the 10-20 International system of electrode placement. Electrical activity was acquired at a sampling rate of '500Hz'$  and reviewed with a high frequency filter of '70Hz'$  and a low frequency filter of '1Hz'$ . EEG data were recorded continuously and digitally stored.   Description: EEG showed continuous generalized 3 to 5 Hz theta delta slowing. Sleep was characterized by vertex waves, sleep spindles (12 to 14 Hz), maximal frontocentral region. Hyperventilation and photic stimulation were not performed.     IMPRESSION: This study is suggestive of moderate diffuse encephalopathy, nonspecific etiology.  No seizures or epileptiform discharges were seen throughout the recording.  Earma Nicolaou Barbra Sarks

## 2020-10-26 NOTE — Progress Notes (Signed)
Brief Neuro Update:  Notified by RN that patient has unequal pupils. On my evaluation, his R pupil is about 66m and left is about 218m Both pupils are not reactive to bright light. He does have a cough, a gag, corneals are positive and dolls eyes are negative. He did not have any spontaneous movement in any extremities. He did have facial grimace to nares stimulation and the grimace was symmetric.  Recs: - Will get a STAT CTH given unequal and unreactive pupil. - Updated patient's fiance over the phone.  SaYoeager Number [phone]

## 2020-10-26 NOTE — Progress Notes (Addendum)
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  No Known Allergies  Patient Measurements: Height: '5\' 10"'$  (177.8 cm) Weight: (!) 152 kg (335 lb 1.6 oz) IBW/kg (Calculated) : 73 Heparin Dosing Weight: 110 kg  Vital Signs: Temp: 96.9 F (36.1 C) (03/15 0715) Temp Source: Axillary (03/15 0715) BP: 129/93 (03/15 1030) Pulse Rate: 92 (03/15 1030)  Labs: Recent Labs    10/28/2020 2254 10/26/20 0006 10/26/20 0203  HGB 11.8* 12.6* 12.9*  HCT 35.6* 37.0* 38.0*  PLT 476*  --   --   APTT 27  --   --   LABPROT 16.7*  --   --   INR 1.4*  --   --   CREATININE 2.00* 2.00*  --     Estimated Creatinine Clearance: 74.1 mL/min (A) (by C-G formula based on SCr of 2 mg/dL (H)).   Medical History: Past Medical History:  Diagnosis Date  . Hypertension   . Stroke [facility])     Assessment: 89 YOM with history of HTN and CVA presented with acute CVA, s/p tPA on overnight 3/14-3/15.  MRI shows acute infarcts with petechial hemorrhage.  ECHO on 3/15 shows a large hyperechoic mass in left ventricle and Pharmacy consulted to dose IV heparin using the stroke protocol.  Will be conservative with initial dosing, particularly after tPA administration.  SCr 2, H/H stable, plts elevated.  Bloody drainage from stomach per RN.  Goal of Therapy:  Heparin level 0.3-0.5 units/ml with acute CVA Monitor platelets by anticoagulation protocol: Yes   Plan:  No bolus per stroke protocol - start heparin infusion at 1150 units/hr Check 6 hr heparin level Daily heparin level and CBC  Thuy D. Mina Marble, PharmD, BCPS, [place] 10/26/2020, 11:26 AM  ============================  Addendum: Per Neuro, delay starting IV heparin until 24 hours after tPA (was on for ~10 min and then turned off) Plan as above Monitor closely for bleeding  Thuy D. Mina Marble, PharmD, BCPS, [place] 10/26/2020, 12:39 PM

## 2020-10-26 NOTE — ED Provider Notes (Addendum)
[facility] EMERGENCY DEPARTMENT Provider Note   CSN: [id number] Arrival date & time: 11/07/2020  2238  An emergency department physician performed an initial assessment on this suspected stroke patient at 2250.  History Chief Complaint  Patient presents with  . LSN: 9:45 pm /Right arm Numbness    Jesus Osborne is a 40 y.o. male.  HPI     This is a 40 year old male with history of hypertension who presents with strokelike symptoms.  He was screened at the bridge prior to my arrival.  See additional notes for details.  Had onset of right-sided arm and leg numbness and weakness at approximately 945.  Patient reports that he has been in jail and has not taking any medications recently.  Patient also states that he has had some shortness of breath over the last 24 hours.  No cough or fevers.  No known sick contacts.  He does report he was vaccinated for COVID-19.  Level 5 caveat for acuity of condition.  Code stroke initiated from triage.  Past Medical History:  Diagnosis Date  . Hypertension   . Stroke [facility])     Patient Active Problem List   Diagnosis Date Noted  . Acute ischemic left MCA stroke ([place]) 10/26/2020    History reviewed. No pertinent surgical history.     No family history on file.  Social History   Tobacco Use  . Smoking status: Never Smoker  . Smokeless tobacco: Never Used  Substance Use Topics  . Alcohol use: Never  . Drug use: Never    Home Medications Prior to Admission medications   Not on File    Allergies    Patient has no known allergies.  Review of Systems   Review of Systems  Constitutional: Negative for fever.  Respiratory: Positive for shortness of breath. Negative for cough.   Cardiovascular: Negative for chest pain.  Gastrointestinal: Negative for abdominal pain.  Neurological: Positive for weakness and numbness. Negative for headaches.  All other systems reviewed and are negative.   Physical Exam Updated  Vital Signs BP (!) 114/91 (BP Location: Left Wrist)   Pulse 87   Temp 98.1 F (36.7 C) (Oral)   Resp (!) 23   Ht 1.753 m ('5\' 9"'$ )   Wt (!) 152 kg   SpO2 93%   BMI 49.49 kg/m   Physical Exam Vitals and nursing note reviewed.  Constitutional:      Appearance: He is well-developed.     Comments: Morbidly obese, ill-appearing but nontoxic  HENT:     Head: Normocephalic and atraumatic.     Mouth/Throat:     Mouth: Mucous membranes are dry.  Eyes:     Pupils: Pupils are equal, round, and reactive to light.  Cardiovascular:     Rate and Rhythm: Normal rate and regular rhythm.     Heart sounds: Normal heart sounds. No murmur heard.   Pulmonary:     Effort: Respiratory distress present.     Breath sounds: Normal breath sounds. No wheezing.     Comments: Tachypnea, Rales bilateral lung fields right greater than left, increased work of breathing Abdominal:     General: Bowel sounds are normal.     Palpations: Abdomen is soft.     Tenderness: There is no abdominal tenderness. There is no rebound.  Musculoskeletal:     Cervical back: Neck supple.     Right lower leg: No edema.     Left lower leg: No edema.  Lymphadenopathy:  Cervical: No cervical adenopathy.  Skin:    General: Skin is warm and dry.  Neurological:     Mental Status: He is alert and oriented to person, place, and time.     Comments: Cranial nerves II through XII intact, drift noted right upper extremity with ataxia, strength appears intact Neuro exam limited as neurology at the bedside doing full and complete exam  Psychiatric:        Mood and Affect: Mood normal.     ED Results / Procedures / Treatments   Labs (all labs ordered are listed, but only abnormal results are displayed) Labs Reviewed  PROTIME-INR - Abnormal; Notable for the following components:      Result Value   Prothrombin Time 16.7 (*)    INR 1.4 (*)    All other components within normal limits  CBC - Abnormal; Notable for the  following components:   WBC 15.9 (*)    Hemoglobin 11.8 (*)    HCT 35.6 (*)    MCV 77.7 (*)    MCH 25.8 (*)    RDW 16.0 (*)    Platelets 476 (*)    nRBC 1.4 (*)    All other components within normal limits  DIFFERENTIAL - Abnormal; Notable for the following components:   Neutro Abs 13.4 (*)    Abs Immature Granulocytes 0.18 (*)    All other components within normal limits  COMPREHENSIVE METABOLIC PANEL - Abnormal; Notable for the following components:   Sodium 127 (*)    Potassium 3.4 (*)    Chloride 93 (*)    Glucose, Bld 289 (*)    BUN 41 (*)    Creatinine, Ser 2.00 (*)    Calcium 7.8 (*)    Albumin 1.5 (*)    ALT 66 (*)    Alkaline Phosphatase 207 (*)    GFR, Estimated 43 (*)    All other components within normal limits  CBG MONITORING, ED - Abnormal; Notable for the following components:   Glucose-Capillary 281 (*)    All other components within normal limits  I-STAT CHEM 8, ED - Abnormal; Notable for the following components:   Sodium 129 (*)    Potassium 3.3 (*)    Chloride 93 (*)    BUN 40 (*)    Creatinine, Ser 2.00 (*)    Glucose, Bld 284 (*)    Calcium, Ion 0.96 (*)    Hemoglobin 12.6 (*)    HCT 37.0 (*)    All other components within normal limits  RESP PANEL BY RT-PCR (FLU A&B, COVID) ARPGX2  ETHANOL  APTT  RAPID URINE DRUG SCREEN, HOSP PERFORMED  URINALYSIS, ROUTINE W REFLEX MICROSCOPIC  HIV ANTIBODY (ROUTINE TESTING W REFLEX)  HEMOGLOBIN A1C  LIPID PANEL  ANA W/REFLEX IF POSITIVE  ANCA TITERS  MPO/PR-3 (ANCA) ANTIBODIES  ANTIPHOSPHOLIPID SYNDROME EVAL, BLD  ANTITHROMBIN III  C-REACTIVE PROTEIN  ANTI-DNA ANTIBODY, DOUBLE-STRANDED  FACTOR 5 LEIDEN  MTHFR DNA ANALYSIS  PROTEIN C ACTIVITY  PROTEIN S ACTIVITY  SEDIMENTATION RATE    EKG EKG Interpretation  Date/Time:  Tuesday October 26 2020 00:32:42 EDT Ventricular Rate:  87 PR Interval:    QRS Duration: 110 QT Interval:  402 QTC Calculation: 484 R Axis:     Text Interpretation: Sinus  rhythm Biatrial enlargement Borderline T wave abnormalities Borderline prolonged QT interval Confirmed by Thayer Jew [phone]) on 10/26/2020 1:05:01 AM   Radiology CT HEAD CODE STROKE WO CONTRAST  Result Date: 10/15/2020 CLINICAL DATA:  Code stroke.  Initial evaluation for acute neuro deficit, stroke suspected. EXAM: CT HEAD WITHOUT CONTRAST TECHNIQUE: Contiguous axial images were obtained from the base of the skull through the vertex without intravenous contrast. COMPARISON:  Prior head CT from 10/28/2005 FINDINGS: Brain: Cerebral volume within normal limits. Vague wedge-shaped hypodensity seen involving the high posterior left parietal lobe, postcentral gyrus, consistent with an acute posterior left MCA distribution infarct (series 2, image 25). No associated hemorrhage or mass effect. No other acute large vessel territory infarct or hemorrhage elsewhere within the brain. Small remote lacunar infarcts present at the left thalamus and left cerebellum. Focal prominence of the extra-axial space at the bilateral occipital lobes could reflect areas of focal encephalomalacia related to prior ischemia or possibly focal prominent cortical sulcation. No mass lesion, midline shift or mass effect. No hydrocephalus or extra-axial fluid collection. Vascular: No appreciable hyperdense vessel. Skull: Scalp soft tissues and calvarium within normal limits. Sinuses/Orbits: Globes and orbital soft tissues within normal limits. Paranasal sinuses are clear. No mastoid effusion. Other: None. ASPECTS [facility] Stroke Program Early CT Score) - Ganglionic level infarction (caudate, lentiform nuclei, internal capsule, insula, M1-M3 cortex): 7 - Supraganglionic infarction (M4-M6 cortex): 2 Total score (0-10 with 10 being normal): 9 IMPRESSION: 1. Wedge-shaped hypodensity at the posterior left parietal lobe, consistent with an evolving posterior left MCA distribution infarct. No hemorrhage. 2. ASPECTS is 9. 3. Additional remote  lacunar infarcts at the left thalamus and left cerebellum. 4. Focal prominence of the extra-axial space at the bilateral occipital lobes, indeterminate, but could reflect encephalomalacia related to prior ischemia or other insult versus focally prominent cortical sulcation. Critical Value/emergent results were called by telephone at the time of interpretation on 10/13/2020 at 11:25 pm to provider Dr. Lorrin Goodell. Electronically Signed   By: Jeannine Boga M.D.   On: 10/12/2020 23:38    Procedures .Critical Care Performed by: Merryl Hacker, MD Authorized by: Merryl Hacker, MD   Critical care provider statement:    Critical care time (minutes):  81   Critical care was necessary to treat or prevent imminent or life-threatening deterioration of the following conditions:  CNS failure or compromise   Critical care was time spent personally by me on the following activities:  Discussions with consultants, evaluation of patient's response to treatment, examination of patient, ordering and performing treatments and interventions, ordering and review of laboratory studies, ordering and review of radiographic studies, pulse oximetry, re-evaluation of patient's condition, obtaining history from patient or surrogate and review of old charts  Procedure Name: Intubation Date/Time: 10/26/2020 1:39 AM Performed by: Merryl Hacker, MD Pre-anesthesia Checklist: Patient identified, Patient being monitored, Emergency Drugs available, Timeout performed and Suction available Oxygen Delivery Method: Non-rebreather mask Preoxygenation: Pre-oxygenation with 100% oxygen Induction Type: Rapid sequence Ventilation: Mask ventilation without difficulty Laryngoscope Size: Glidescope and 4 Grade View: Grade I Tube size: 7.5 mm Number of attempts: 1 Placement Confirmation: ETT inserted through vocal cords under direct vision,  CO2 detector and Breath sounds checked- equal and bilateral Secured at: 26  cm Tube secured with: ETT holder Dental Injury: Bloody posterior oropharynx  Comments: Prior to intubation, patient noted to have some blood in the oropharynx, difficult to assess for injury given his mental status.          Medications Ordered in ED Medications  alteplase (ACTIVASE) 1 mg/mL infusion 90 mg (81 mg Intravenous New Bag/Given 10/26/20 0002)    Followed by  0.9 %  sodium chloride infusion (has no administration in time range)  clevidipine (CLEVIPREX) infusion 0.5 mg/mL (2 mg/hr Intravenous Rate/Dose Change 10/26/20 0012)   stroke: mapping our early stages of recovery book (has no administration in time range)  0.9 %  sodium chloride infusion (has no administration in time range)  acetaminophen (TYLENOL) tablet 650 mg (has no administration in time range)    Or  acetaminophen (TYLENOL) 160 MG/5ML solution 650 mg (has no administration in time range)    Or  acetaminophen (TYLENOL) suppository 650 mg (has no administration in time range)  senna-docusate (Senokot-S) tablet 1 tablet (has no administration in time range)  pantoprazole (PROTONIX) injection 40 mg (has no administration in time range)  labetalol (NORMODYNE) injection 10 mg (has no administration in time range)  labetalol (NORMODYNE) injection 10 mg (10 mg Intravenous Given 10/19/2020 2320)  labetalol (NORMODYNE) injection 10 mg (10 mg Intravenous Given 11/10/2020 2328)  iohexol (OMNIPAQUE) 350 MG/ML injection 100 mL (100 mLs Intravenous Contrast Given 10/26/20 0001)    ED Course  I have reviewed the triage vital signs and the nursing notes.  Pertinent labs & imaging results that were available during my care of the patient were reviewed by me and considered in my medical decision making (see chart for details).  Clinical Course as of 10/26/20 0138  Tue Oct 26, 2020  0115 I was called emergently to the patient's bedside.  Acute change in mental status.  Critical care PA is at the bedside as well.  Reports that he  initially was answering questions and then slumped over to the left side.  No seizure activity noted.  He is minimally responsive at this time.  Eyes are deviated.  Pupils are 2 mm and minimally reactive.  Concern for conversion bleed on TPA.  tPA was stopped.  He will need repeat head CT.  Given his compromised airway, patient was intubated for airway control prior to taking to the CT scanner. [CH]    Clinical Course User Index [CH] Briannia Laba, Barbette Hair, MD   MDM Rules/Calculators/A&P                          Patient presents with weakness and numbness.  Code stroke initiated from triage.  Separate MSE from alternative provider.  Patient was sent emergently to the CT scanner.  Neurology at the bedside.  CT shows a hypodensity in the left MCA territory.  This is consistent with his symptoms.  Patient was given TPA in the scanner.  IV access was difficult to obtain which did delay TPA administration.  Of note, patient also reporting some shortness of breath.  He is morbidly obese.  He has some rales on the right greater than left lung.  O2 sats 88 to 90% on room air.  No known history of sleep apnea and does not use BiPAP.  Covid testing sent.  Chest x-ray ordered.  Patient is to be admitted to the neuro ICU with neurology.  They report that they have consulted pulmonology to further evaluate the patient shortness of breath.  Will defer further work-up to pulmonology consult as he appears to be stable on nasal cannula.  Final Clinical Impression(s) / ED Diagnoses Final diagnoses:  Cerebrovascular accident (CVA), unspecified mechanism [facility])    Rx / DC Orders ED Discharge Orders    None       Elba Schaber, Barbette Hair, MD 10/26/20 [id number]    Merryl Hacker, MD 10/26/20 0140

## 2020-10-27 ENCOUNTER — Inpatient Hospital Stay: Payer: Self-pay

## 2020-10-27 ENCOUNTER — Inpatient Hospital Stay (HOSPITAL_COMMUNITY): Payer: Medicaid Other

## 2020-10-27 ENCOUNTER — Other Ambulatory Visit (HOSPITAL_COMMUNITY): Payer: Self-pay

## 2020-10-27 LAB — CBC
HCT: 29.7 % — ABNORMAL LOW (ref 39.0–52.0)
Hemoglobin: 10.4 g/dL — ABNORMAL LOW (ref 13.0–17.0)
MCH: 27.7 pg (ref 26.0–34.0)
MCHC: 35 g/dL (ref 30.0–36.0)
MCV: 79.2 fL — ABNORMAL LOW (ref 80.0–100.0)
Platelets: 410 10*3/uL — ABNORMAL HIGH (ref 150–400)
RBC: 3.75 MIL/uL — ABNORMAL LOW (ref 4.22–5.81)
RDW: 16.2 % — ABNORMAL HIGH (ref 11.5–15.5)
WBC: 12.5 10*3/uL — ABNORMAL HIGH (ref 4.0–10.5)
nRBC: 0.5 % — ABNORMAL HIGH (ref 0.0–0.2)

## 2020-10-27 LAB — BASIC METABOLIC PANEL
Anion gap: 11 (ref 5–15)
BUN: 42 mg/dL — ABNORMAL HIGH (ref 6–20)
CO2: 23 mmol/L (ref 22–32)
Calcium: 7.1 mg/dL — ABNORMAL LOW (ref 8.9–10.3)
Chloride: 98 mmol/L (ref 98–111)
Creatinine, Ser: 2.14 mg/dL — ABNORMAL HIGH (ref 0.61–1.24)
GFR, Estimated: 40 mL/min — ABNORMAL LOW (ref >60)
Glucose, Bld: 88 mg/dL (ref 70–99)
Potassium: 5.1 mmol/L (ref 3.5–5.1)
Sodium: 132 mmol/L — ABNORMAL LOW (ref 135–145)

## 2020-10-27 LAB — ANCA TITERS
Atypical P-ANCA titer: <1:20 {titer}
C-ANCA: <1:20 {titer}
P-ANCA: <1:20 {titer}

## 2020-10-27 LAB — BRAIN NATRIURETIC PEPTIDE: B Natriuretic Peptide: 664.4 pg/mL — ABNORMAL HIGH (ref 0.0–100.0)

## 2020-10-27 LAB — GLUCOSE, CAPILLARY
Glucose-Capillary: 109 mg/dL — ABNORMAL HIGH (ref 70–99)
Glucose-Capillary: 95 mg/dL (ref 70–99)
Glucose-Capillary: 112 mg/dL — ABNORMAL HIGH (ref 70–99)
Glucose-Capillary: 108 mg/dL — ABNORMAL HIGH (ref 70–99)
Glucose-Capillary: 162 mg/dL — ABNORMAL HIGH (ref 70–99)
Glucose-Capillary: 180 mg/dL — ABNORMAL HIGH (ref 70–99)

## 2020-10-27 LAB — ANTI-DNA ANTIBODY, DOUBLE-STRANDED: ds DNA Ab: <1 IU/mL (ref 0–9)

## 2020-10-27 LAB — PROTEIN C ACTIVITY: Protein C Activity: 171 % (ref 73–180)

## 2020-10-27 LAB — LEGIONELLA PNEUMOPHILA SEROGP 1 UR AG: L. pneumophila Serogp 1 Ur Ag: NEGATIVE

## 2020-10-27 LAB — TRIGLYCERIDES
Triglycerides: 1629 mg/dL — ABNORMAL HIGH (ref <150)
Triglycerides: 104 mg/dL (ref <150)

## 2020-10-27 LAB — SODIUM
Sodium: 141 mmol/L (ref 135–145)
Sodium: 144 mmol/L (ref 135–145)

## 2020-10-27 LAB — PROTEIN S ACTIVITY: Protein S Activity: 146 % — ABNORMAL HIGH (ref 63–140)

## 2020-10-27 LAB — MAGNESIUM: Magnesium: 2.3 mg/dL (ref 1.7–2.4)

## 2020-10-27 LAB — PHOSPHORUS: Phosphorus: 4.7 mg/dL — ABNORMAL HIGH (ref 2.5–4.6)

## 2020-10-27 LAB — HEPARIN LEVEL (UNFRACTIONATED): Heparin Unfractionated: <0.1 IU/mL — ABNORMAL LOW (ref 0.30–0.70)

## 2020-10-27 LAB — ANA W/REFLEX IF POSITIVE: Anti Nuclear Antibody (ANA): NEGATIVE

## 2020-10-27 MED ORDER — ROSUVASTATIN CALCIUM 5 MG PO TABS
5.0000 mg | ORAL_TABLET | Freq: Every day | ORAL | Status: DC
  Administered 2020-10-27 – 2020-11-05 (×10): 5 mg
  Filled 2020-10-27 (×10): qty 1

## 2020-10-27 MED ORDER — PROSOURCE TF PO LIQD
45.0000 mL | Freq: Two times a day (BID) | ORAL | Status: DC
  Administered 2020-10-27: 45 mL
  Filled 2020-10-27: qty 45

## 2020-10-27 MED ORDER — DEXMEDETOMIDINE HCL IN NACL 400 MCG/100ML IV SOLN
0.4000 ug/kg/h | INTRAVENOUS | Status: DC
  Administered 2020-10-27 – 2020-10-28 (×2): 0.4 ug/kg/h via INTRAVENOUS
  Administered 2020-10-28: 0.5 ug/kg/h via INTRAVENOUS
  Administered 2020-10-28: 0.6 ug/kg/h via INTRAVENOUS
  Administered 2020-10-28: 0.4 ug/kg/h via INTRAVENOUS
  Administered 2020-10-29: 0.7 ug/kg/h via INTRAVENOUS
  Administered 2020-10-29: 0.6 ug/kg/h via INTRAVENOUS
  Filled 2020-10-27 (×7): qty 100

## 2020-10-27 MED ORDER — CARVEDILOL 3.125 MG PO TABS
6.2500 mg | ORAL_TABLET | Freq: Two times a day (BID) | ORAL | Status: DC
  Administered 2020-10-27 – 2020-10-30 (×8): 6.25 mg
  Filled 2020-10-27 (×10): qty 2

## 2020-10-27 MED ORDER — VITAL HIGH PROTEIN PO LIQD: 1000.0000 mL | ORAL | Status: DC

## 2020-10-27 MED ORDER — CHLORHEXIDINE GLUCONATE CLOTH 2 % EX PADS
6.0000 | MEDICATED_PAD | Freq: Every day | CUTANEOUS | Status: DC
  Administered 2020-10-28 – 2020-11-05 (×9): 6 via TOPICAL

## 2020-10-27 MED ORDER — FUROSEMIDE 10 MG/ML IJ SOLN
40.0000 mg | Freq: Once | INTRAMUSCULAR | Status: AC
  Administered 2020-10-27: 40 mg via INTRAVENOUS
  Filled 2020-10-27: qty 4

## 2020-10-27 MED ORDER — VITAL AF 1.2 CAL PO LIQD
1000.0000 mL | ORAL | Status: DC
  Administered 2020-10-27 – 2020-11-01 (×6): 1000 mL
  Filled 2020-10-27 (×7): qty 1000

## 2020-10-27 MED ORDER — SODIUM CHLORIDE 0.9% FLUSH
10.0000 mL | INTRAVENOUS | Status: DC | PRN
Start: 2020-10-27 — End: 2020-11-05

## 2020-10-27 MED ORDER — SODIUM CHLORIDE 0.9% FLUSH
10.0000 mL | Freq: Two times a day (BID) | INTRAVENOUS | Status: DC
  Administered 2020-10-27: 40 mL
  Administered 2020-10-28 – 2020-11-05 (×17): 10 mL

## 2020-10-27 MED ORDER — PROSOURCE TF PO LIQD
90.0000 mL | Freq: Three times a day (TID) | ORAL | Status: DC
  Administered 2020-10-27 – 2020-11-05 (×27): 90 mL
  Filled 2020-10-27 (×29): qty 90

## 2020-10-27 MED ORDER — PANTOPRAZOLE SODIUM 40 MG PO PACK
40.0000 mg | PACK | Freq: Every day | ORAL | Status: DC
  Administered 2020-10-27 – 2020-11-04 (×9): 40 mg
  Filled 2020-10-27 (×9): qty 20

## 2020-10-27 NOTE — Progress Notes (Signed)
PT Cancellation Note  Patient Details Name: Jesus Osborne MRN: [id number] DOB: [birth year]   Cancelled Treatment:    Reason Eval/Treat Not Completed: Active bedrest order;Patient's level of consciousness. Pt with active bedrest orders and not appropriate for PT eval at this time due to pt being unable to be aroused with all stimuli at this time, per RN. Will follow-up on a later date once more appropriate.  Moishe Spice, PT, DPT Acute Rehabilitation Services  Pager: [phone] Office: [place] 10/27/2020, 8:32 AM

## 2020-10-27 NOTE — Progress Notes (Addendum)
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  No Known Allergies  Patient Measurements: Height: '5\' 10"'$  (177.8 cm) Weight: (!) 152 kg (335 lb 1.6 oz) IBW/kg (Calculated) : 73 Heparin Dosing Weight: 110 kg  Vital Signs: Temp: 97.5 F (36.4 C) (03/16 0800) Temp Source: Oral (03/16 0800) BP: 118/96 (03/16 1000) Pulse Rate: 89 (03/16 1000)  Labs: Recent Labs    10/17/2020 2254 10/26/20 0006 10/26/20 0203 10/26/20 1633 10/27/20 0322 10/27/20 0952  HGB 11.8* 12.6* 12.9*  --  10.4*  --   HCT 35.6* 37.0* 38.0*  --  29.7*  --   PLT 476*  --   --   --  410*  --   APTT 27  --   --   --   --   --   LABPROT 16.7*  --   --   --   --   --   INR 1.4*  --   --   --   --   --   HEPARINUNFRC  --   --   --   --   --  <0.10*  CREATININE 2.00* 2.00*  --  2.02* 2.14*  --     Estimated Creatinine Clearance: 69.2 mL/min (A) (by C-G formula based on SCr of 2.14 mg/dL (H)).   Assessment: 55 YOM with history of HTN and CVA presented with acute CVA, s/p tPA on overnight 3/14-3/15.  MRI shows acute infarcts with petechial hemorrhage.  ECHO on 3/15 shows a large hyperechoic mass in left ventricle and Pharmacy consulted to dose IV heparin using the stroke protocol.  Will be conservative with dosing due to bleeding risk.  Heparin level undetectable; no issue with heparin infusion.  Blood NG output resolving per RN.  Hemoglobin low stable, platelet count elevated.  Goal of Therapy:  Heparin level 0.3-0.5 units/ml with acute CVA Monitor platelets by anticoagulation protocol: Yes   Plan:  No bolus per stroke protocol - increase heparin infusion to 1500 units/hr Check 6 hr heparin level Daily heparin level and CBC  Ciclaly Mulcahey D. Mina Marble, PharmD, BCPS, [place] 10/27/2020, 11:10 AM

## 2020-10-27 NOTE — Progress Notes (Signed)
Spoke with patient's sisters on the phone. Pt also has a brother but he is in the hospital in [facility] and an 39 year old son as well. Jesus Osborne would like to be the primary update for pt. She is an ICU RN at [employer] but is on a travel assignment at the time. Ziza Hastings, Rande Brunt, RN

## 2020-10-27 NOTE — Procedures (Signed)
Central Venous Catheter Insertion Procedure Note  Jesus Osborne  [id number]  October 18, 1980  Date:10/27/20  Time:10:15 PM   Provider Performing:Chariti Havel   Procedure: Insertion of Non-tunneled Central Venous [id number]) with US guidance [id number])   Indication(s) Medication administration  Consent Risks of the procedure as well as the alternatives and risks of each were explained to the patient and/or caregiver.  Consent for the procedure was obtained and is signed in the bedside chart  Anesthesia Topical only with 1% lidocaine   Timeout Verified patient identification, verified procedure, site/side was marked, verified correct patient position, special equipment/implants available, medications/allergies/relevant history reviewed, required imaging and test results available.  Sterile Technique Maximal sterile technique including full sterile barrier drape, hand hygiene, sterile gown, sterile gloves, mask, hair covering, sterile ultrasound probe cover (if used).  Procedure Description Area of catheter insertion was cleaned with chlorhexidine and draped in sterile fashion.  With real-time ultrasound guidance a central venous catheter was placed into the right internal jugular vein. Nonpulsatile blood flow and easy flushing noted in all ports.  The catheter was sutured in place and sterile dressing applied.  Complications/Tolerance None; patient tolerated the procedure well. Chest X-ray is ordered to verify placement for internal jugular or subclavian cannulation.   Chest x-ray is not ordered for femoral cannulation.  EBL Minimal  Specimen(s) None

## 2020-10-27 NOTE — TOC CAGE-AID Note (Signed)
Transition of Care [facility]) - CAGE-AID Screening   Patient Details  Name: Jesus Osborne MRN: [id number] Date of Birth: January 19, 1981  Transition of Care [facility]) CM/SW Contact:    Benard Halsted, LCSW Phone Number: 10/27/2020, 9:52 AM   Clinical Narrative: Patient intubated and unable to participate at this time.    CAGE-AID Screening: Substance Abuse Screening unable to be completed due to: : Patient unable to participate

## 2020-10-27 NOTE — Progress Notes (Signed)
OT Cancellation Note  Patient Details Name: Jesus Osborne MRN: [id number] DOB: 1981/07/04   Cancelled Treatment:    Reason Eval/Treat Not Completed: Active bedrest order (on 3% saline) OT order received and appreciated however this conflicts with current bedrest order set. Please increase activity tolerance as appropriate and remove bedrest from orders. . Please contact OT at [phone] if bed rest order is discontinued. OT will hold evaluation at this time and will check back as time allows pending increased activity orders.   Billey Chang, OTR/L  Acute Rehabilitation Services Pager: [phone] Office: [phone] .  10/27/2020, 8:20 AM

## 2020-10-27 NOTE — Progress Notes (Addendum)
eLink Physician-Brief Progress Note Patient Name: ARRION NEVILLS DOB: Mar 15, 1981 MRN: [id number]   Date of Service  10/27/2020  HPI/Events of Note  K+ 5.1 Triglyceride level 1,629 (was 127 yesterday).  eICU Interventions  K+ replacement scheduled for this a.m. cancelled. Recheck of triglyceride level ordered.        Kerry Kass Shadi Larner 10/27/2020, 6:12 AM

## 2020-10-27 NOTE — Progress Notes (Signed)
Initial Nutrition Assessment  DOCUMENTATION CODES:   Morbid obesity  INTERVENTION:   Initiate tube feeding via OG tube: Vital AF 1.2 at 55 ml/h (1320 ml per day) Prosource TF 90 ml TID  Provides 1824 kcal, 165 gm protein, 1070 ml free water daily   NUTRITION DIAGNOSIS:   Inadequate oral intake related to inability to eat as evidenced by NPO status.  GOAL:   Provide needs based on ASPEN/SCCM guidelines  MONITOR:   TF tolerance  REASON FOR ASSESSMENT:   Consult Enteral/tube feeding initiation and management  ASSESSMENT:     Pt with PMH of HTN and CVA admitted with acute multifocal embolic strokes particularly involving the left cerebellum and pons and midbrain  s/p tPA  Pt discussed during ICU rounds and with RN.  Per CCM pt also with L ventricular thrombus and 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 cardiomyopathy. A1c 8.3 on admission however no DM listed in medical history.   Patient is currently intubated on ventilator support MV: 14 L/min Temp (24hrs), Avg:98.2 F (36.8 C), Min:97.5 F (36.4 C), Max:99 F (37.2 C)  Medications reviewed and include: colace, SSI, miralax Hypertonic saline  Labs reviewed: PO4: 4.7, A1c: 8.3   OG tube: tip in distal stomach or proximal duodenum   NUTRITION - FOCUSED PHYSICAL EXAM:  Flowsheet Row Most Recent Value  Orbital Region No depletion  Upper Arm Region No depletion  Thoracic and Lumbar Region No depletion  Buccal Region No depletion  Temple Region No depletion  Clavicle Bone Region No depletion  Clavicle and Acromion Bone Region No depletion  Scapular Bone Region No depletion  Dorsal Hand No depletion  Patellar Region No depletion  Anterior Thigh Region No depletion  Posterior Calf Region No depletion  Edema (RD Assessment) Mild  Hair Reviewed  Eyes Unable to assess  Mouth Unable to assess  Skin Reviewed  Nails Reviewed       Diet Order:   Diet Order            Diet NPO time specified  Diet effective now                  EDUCATION NEEDS:   No education needs have been identified at this time  Skin:  Skin Assessment: Reviewed RN Assessment  Last BM:  unknown  Height:   Ht Readings from Last 1 Encounters:  10/26/20 '5\' 10"'$  (1.778 m)    Weight:   Wt Readings from Last 1 Encounters:  11/03/2020 (!) 152 kg    Ideal Body Weight:     BMI:  Body mass index is 48.08 kg/m.  Estimated Nutritional Needs:   Kcal:  1700-2000  Protein:  151-188 grams  Fluid:  >1.7 L/day  Lockie Pares., RD, LDN, CNSC See AMiON for contact information

## 2020-10-27 NOTE — Progress Notes (Signed)
NAME:  Jesus Osborne, MRN:  [id number], DOB:  01/22/1981, LOS: 1 ADMISSION DATE:  10/20/2020, CONSULTATION DATE:  10/26/20 REFERRING MD:  Lorrin Goodell, CHIEF COMPLAINT:  AMS   Brief History:  Jesus Osborne is a 40 y.o. male who was admitted 3/14 with CVA s/p tPA.  While in [place], had hypoxia requiring NRB then 2L O2 via [place].  Later had change in mental status with minimal response; therefore, intubated before returning to CT.  History of Present Illness:  Pt is encephelopathic; therefore, this HPI is obtained from chart review. Jesus Osborne is a 40 y.o. male who has a PMH including but not limited to prior CVA, HTN, THC and tobacco dependence (see "past medical history" for rest).  He presented to [facility] ED 3/14 with R arm numbness and R sided weakness.  He was taken to CT which was concerning for evolving L MCA infarct.  HE received tPA at 002 (slighly delay due to difficult IV access).  While in CT, he had desaturation requiring NRB and later placed on 2L O2 via . While in ED, later had change in mental status around 0115 with left gaze deviation and decrease in alertness.  Decision made to intubate prior to taking back to CT for repeat scan.  Past Medical History:  has Acute ischemic left MCA stroke ([place]) on their problem list.  Significant Hospital Events:  3/14 > admit. 3/15 > intubated.  Consults:  PCCM  Procedures:  ETT 3/15>  Significant Diagnostic Tests:  -CT head 3/14 (11:22) > evolving posterior L MCA infarct, no hemorrhage, additional remote lacunar infarcts at the left thalamus and left cerebellum. -CTA head / neck 3/14 (12:01)> negative for emergent large vessel occlusoin -CTP 3/14 (12:01)> 8 cc area of acute core infarct at the posterior left parietal lobe -CT head 3/15 (01:56)> no acute intracranial hemorrhage -MRI head 3/15 (06:40)>  1.Acute infarcts in the left left superior cerebellum, artery of [place], and left frontal parietal cortex. 2. Petechial  hemorrhage in the left parietal region. 3. Remote hypertensive pattern hemorrhages. -Echo 3/15>>20x81m moderately mobile mass on inferior wall of the LV, increased echogenicity likely representing thrombus but cannot exclude neoplasm, EF 40% -CT head 3/15>>posterior mass effect with partial effacement of 4th ventricle but no obstructive hydrocephalus -EEG 3/15-3/16>> moderate diffuse encephalopathy. No epileptiform discharges  Micro Data:  Sputum 3/15 >  RVP 3/15 >   Antimicrobials:  Unasyn 3/15 >    Interim History / Subjective:  Overnight pt had left sided shaking after ETT suctioning. Concern for seizure and possible posturing. Stat CT head ordered along with EEG and pt started on 3% NS. Pt unresponsive to verbal or painful stimuli this AM off propofol. Intermittent apparent non-purposeful movement of left arm and left toes. Large amount of black-red gastric drainage from NG tube yesterday.  Objective   Blood pressure (!) 107/93, pulse 93, temperature 99 F (37.2 C), temperature source Axillary, resp. rate (!) 23, height '5\' 10"'$  (1.778 m), weight (!) 152 kg, SpO2 100 %.    Vent Mode: PRVC FiO2 (%):  [40 %-50 %] 40 % Set Rate:  [18 bmp] 18 bmp Vt Set:  [580 mL] 580 mL PEEP:  [5 cmH20] 5 cmH20 Plateau Pressure:  [13 cmH20-26 cmH20] 26 cmH20   Intake/Output Summary (Last 24 hours) at 10/27/2020 1257 Last data filed at 10/27/2020 1200 Gross per 24 hour  Intake 1878.37 ml  Output 5300 ml  Net -3421.63 ml   FAutoliv  10/26/2020 2254  Weight: (!) 152 kg    Examination: General: obese male, intubated, unresponsive on no sedation HENT: left pupil ~2m, right pupil ~278m not reactive to light, ET tube in place, MMM Lungs: mechanically ventilation, bilateral inspiratory ronchi Cardiovascular: RRR, normal S1S2, no MRG Abdomen: soft, NABS, 2+pitting edema with peau d'orange appearance of lower abdominal wall Extremities: no peripheral edema, 2+ dorsalis pedis pulses, 1+ radial  pulses Neuro: Unresponsive to verbal or noxious stimuli on no sedation, worsened vertical and horizontal dysconjugate gaze, + corneal reflex, - doll's eye, - menace, intermittent non-purposeful movement of left upper and lower extremities GU: foley in place with small amount of urine  Resolved Hospital Problem list     Assessment & Plan:   Left Frontal Parietal, Left Superior Cerebellar Infarction s/p TPA LV Inferior Wall Thrombus LV thrombus presumed embolic source given distribution of ischemic brain tissue. Injury to ventral midbrain extending into the medial thalamus likely contributing to unresponsive state. -inferior LV wall thrombus found on echo, likely source of emboli -heparin gtt -cont 3% NS at 25 mL/hr, goal Na 150-155, per neuro -q6h Na -goal euthermia, euvolemia, avoid hypotension -follow neuro status and BMP closely -f/u hypercoagulable panel  Diastolic Congestive Heart Failure, EF 40% -elevated BNP with hyponatremia on admission -clinically volume overloaded with pitting edema of abdomen -improvement in Na with diuresis today and yesterday -Lasix 40 mg pending BMP this afternoon -start Coreg 6.25 mg BID -strict I&O, follow BMP  Acute hypoxic Respiratory Failure - Pt minimally responsive and unable to protect his airway - PRVC, 8 mL/kg IBW, FiO2 40, Peep 5  Aspiration Pneumonia Bilateral infiltrates on CXR with elevated WBC and history of AMS suspicious for aspiration -cont Unasyn -f/u sputum culture  Hyponatremia -Na 141 this AM -cont 3% NS with goal of 150-155 -f/u BMP this evening  AKI Underlying diabetes with proteinuria on urinalysis indicative of possible diabetic nephropathy -cont IVF -strict I&O, trend sCr -f/u BMP -consider ACEi after improvement in kidney function  HTN -labetalol to maintain SBP180  Diabetes Mellitus -no history on chart review -SSI resistant coverage -CBG improved overnight -start Crestor '5mg'$  QD  Best practice  (evaluated daily)  Diet: NPO. Pain/Anxiety/Delirium protocol (if indicated): Fentanyl PRN. VAP protocol (if indicated): In place. DVT prophylaxis: SCD's. GI prophylaxis: PPI. Glucose control: SSI. Mobility: Bedrest. Disposition: ICU.  Goals of Care:  Last date of multidisciplinary goals of care discussion: None.  Family and staff present: None. Summary of discussion: None. Follow up goals of care discussion due: 3/21. Code Status:  Full.  Labs   CBC: Recent Labs  Lab 10/24/2020 2254 10/26/20 0006 10/26/20 0203 10/27/20 0322  WBC 15.9*  --   --  12.5*  NEUTROABS 13.4*  --   --   --   HGB 11.8* 12.6* 12.9* 10.4*  HCT 35.6* 37.0* 38.0* 29.7*  MCV 77.7*  --   --  79.2*  PLT 476*  --   --  410*    Basic Metabolic Panel: Recent Labs  Lab 10/22/2020 2254 10/26/20 0006 10/26/20 0203 10/26/20 1633 10/26/20 2124 10/27/20 0322 10/27/20 0952  NA 127* 129* 129* 136 134* 132* 141  K 3.4* 3.3* 3.6 3.1*  --  5.1  --   CL 93* 93*  --  98  --  98  --   CO2 23  --   --  29  --  23  --   GLUCOSE 289* 284*  --  124*  --  88  --  BUN 41* 40*  --  39*  --  42*  --   CREATININE 2.00* 2.00*  --  2.02*  --  2.14*  --   CALCIUM 7.8*  --   --  8.3*  --  7.1*  --   MG  --   --   --   --   --   --  2.3  PHOS  --   --   --   --   --   --  4.7*   GFR: Estimated Creatinine Clearance: 69.2 mL/min (A) (by C-G formula based on SCr of 2.14 mg/dL (H)). Recent Labs  Lab 10/14/2020 2254 10/26/20 0117 10/27/20 0322  PROCALCITON  --  0.96  --   WBC 15.9*  --  12.5*    Liver Function Tests: Recent Labs  Lab 10/13/2020 2254  AST 40  ALT 66*  ALKPHOS 207*  BILITOT 1.2  PROT 7.0  ALBUMIN 1.5*   No results for input(s): LIPASE, AMYLASE in the last 168 hours. No results for input(s): AMMONIA in the last 168 hours.  ABG    Component Value Date/Time   PHART 7.369 10/26/2020 0203   PCO2ART 47.0 10/26/2020 0203   PO2ART 81 (L) 10/26/2020 0203   HCO3 27.1 10/26/2020 0203   TCO2 29  10/26/2020 0203   O2SAT 95.0 10/26/2020 0203     Coagulation Profile: Recent Labs  Lab 10/18/2020 2254  INR 1.4*    Cardiac Enzymes: No results for input(s): CKTOTAL, CKMB, CKMBINDEX, TROPONINI in the last 168 hours.  HbA1C: Hgb A1c MFr Bld  Date/Time Value Ref Range Status  10/26/2020 12:45 AM 8.3 (H) 4.8 - 5.6 % Final    Comment:    (NOTE) Pre diabetes:          5.7%-6.4%  Diabetes:              >6.4%  Glycemic control for   <7.0% adults with diabetes     CBG: Recent Labs  Lab 10/26/20 1915 10/26/20 2314 10/27/20 0319 10/27/20 0726 10/27/20 1129  GLUCAP 150* 100* 109* 95 112*    Review of Systems:   Unable to assess given unresponsive state.  Past Medical History:  He,  has a past medical history of Hypertension and Stroke ([place]).   Surgical History:  History reviewed. No pertinent surgical history.   Social History:   reports that he has never smoked. He has never used smokeless tobacco. He reports that he does not drink alcohol and does not use drugs.   Family History:  His family history is not on file.   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

## 2020-10-27 NOTE — Progress Notes (Addendum)
STROKE TEAM PROGRESS NOTE   INTERVAL HISTORY .Patient was seen by overnight neuro hospitalist for possible seizure activity and was treated with Ativan overnight.EEG monitoring.  Did not show seizure activity but is moderate generalized slowing.  CT scan of the head was repeated last night which showed mass-effect on patient was started overnight   on Hypertonic Saline 3% at 75 cc/hr. He was started on IV Heparin for mass seen in Left Ventricle on Echo.  He continues to be minimally responsive and is intubated. Will have PICC line placed today. Prognosis continues to be poor. Attempts to reach next of kin have not been successful so far.  Vitals:   10/27/20 0740 10/27/20 0800 10/27/20 0900 10/27/20 1000  BP:  (!) 123/99 (!) 116/94 (!) 118/96  Pulse:  80  89  Resp:  (!) 0 (!) 22 (!) 21  Temp:  (!) 97.5 F (36.4 C)    TempSrc:  Oral    SpO2: 100% 100%  100%  Weight:      Height:       CBC:  Recent Labs  Lab 10/19/2020 2254 10/26/20 0006 10/26/20 0203 10/27/20 0322  WBC 15.9*  --   --  12.5*  NEUTROABS 13.4*  --   --   --   HGB 11.8*   < > 12.9* 10.4*  HCT 35.6*   < > 38.0* 29.7*  MCV 77.7*  --   --  79.2*  PLT 476*  --   --  410*   < > = values in this interval not displayed.   Basic Metabolic Panel:  Recent Labs  Lab 10/26/20 1633 10/26/20 2124 10/27/20 0322  NA 136 134* 132*  K 3.1*  --  5.1  CL 98  --  98  CO2 29  --  23  GLUCOSE 124*  --  88  BUN 39*  --  42*  CREATININE 2.02*  --  2.14*  CALCIUM 8.3*  --  7.1*   Lipid Panel:  Recent Labs  Lab 10/26/20 0708 10/27/20 0322  CHOL 101  --   TRIG 127 1,629*  HDL 24*  --   CHOLHDL 4.2  --   VLDL 25  --   LDLCALC 52  --    HgbA1c:  Recent Labs  Lab 10/26/20 0045  HGBA1C 8.3*   Urine Drug Screen:  Recent Labs  Lab 10/26/20 0849  LABOPIA NONE DETECTED  COCAINSCRNUR NONE DETECTED  LABBENZ NONE DETECTED  AMPHETMU NONE DETECTED  THCU POSITIVE*  LABBARB NONE DETECTED    Alcohol Level  Recent Labs   Lab 10/29/2020 2254  ETH <10    IMAGING past 24 hours CT HEAD WO CONTRAST  Result Date: 10/27/2020 CLINICAL DATA:  CVA, history of petechial hemorrhage after tPA administration EXAM: CT HEAD WITHOUT CONTRAST TECHNIQUE: Contiguous axial images were obtained from the base of the skull through the vertex without intravenous contrast. COMPARISON:  10/26/2020 FINDINGS: Brain: Continued evolution of the acute/subacute infarct within the left superior cerebellar hemisphere, ventral midbrain, bilateral thalami, and left parietal region. Punctate petechial hemorrhage within the left parietal infarct unchanged since previous exam. Continued mass effect from the left cerebellar hemispheric infarct, with sulcal effacement and mild mass effect on the fourth ventricle. No new infarcts are identified. Lateral ventricles and remaining midline structures are stable. There are no acute extra-axial fluid collections. Vascular: No hyperdense vessel or unexpected calcification. Skull: Normal. Negative for fracture or focal lesion. Sinuses/Orbits: No acute finding. Other: None. IMPRESSION: 1. Continued evolution  of the acute to subacute infarcts within the left superior cerebellar hemisphere, ventral midbrain, bilateral thalami, and left parietal cortex. Punctate petechial hemorrhage within the left parietal region unchanged, better appreciated on previous MRI than on CT. 2. Minimal mass effect within the posterior fossa related to the cerebellar infarct, with subtle effacement of the fourth ventricle unchanged since prior study. No evidence of hydrocephalus. Electronically Signed   By: Randa Ngo M.D.   On: 10/27/2020 00:33   CT HEAD WO CONTRAST  Result Date: 10/26/2020 CLINICAL DATA:  Stroke, follow-up. EXAM: CT HEAD WITHOUT CONTRAST TECHNIQUE: Contiguous axial images were obtained from the base of the skull through the vertex without intravenous contrast. COMPARISON:  Noncontrast head CT examinations as well as CT  angiogram head/neck and CT perfusion 10/26/2020. Noncontrast head CT 10/27/2020. 10/26/2020. FINDINGS: Brain: Cerebral volume is normal for age. Redemonstrated acute/early subacute infarct within the cortical/subcortical left parietal lobe. Subtle petechial hemorrhage at this site was better appreciated on the MRI performed earlier today. Additional small acute/early subacute left frontoparietal cortical infarcts were better appreciated on the brain MRI performed earlier today. Redemonstrated acute/early subacute infarct within the superior left cerebellar hemisphere with additional acute/early subacute infarction changes within the ventromedial midbrain and medial thalami bilaterally. There is mild posterior fossa mass effect with partial effacement of the fourth ventricle. However, there is no evidence of obstructive hydrocephalus at this time. No interval acute infarct is identified. No extra-axial fluid collection. No evidence of intracranial mass. No midline shift. Vascular: No hyperdense vessel. Skull: Normal. Negative for fracture or focal lesion. Sinuses/Orbits: Visualized orbits show no acute finding. Small volume frothy secretions within the sphenoid sinuses bilaterally. Other: Partially visualized support tubes. IMPRESSION: Redemonstrated acute/early subacute infarcts within the left frontal and parietal lobes, superior left cerebellum and bilateral ventromedial midbrain and medial thalami. Mild petechial hemorrhage within the left parietal lobe was better appreciated on the brain MRI performed earlier today. There is posterior fossa mass effect with partial effacement of the fourth ventricle but no evidence of obstructive hydrocephalus at this time. No evidence of interval acute infarct. Electronically Signed   By: Kellie Simmering DO   On: 10/26/2020 19:46   DG Abd Portable 1V  Result Date: 10/26/2020 CLINICAL DATA:  OG tube placement EXAM: PORTABLE ABDOMEN - 1 VIEW COMPARISON:  None. FINDINGS: OG tube  in place with the tip in the distal stomach or proximal duodenum. Nonobstructive bowel gas pattern. IMPRESSION: OG tube tip in the distal stomach or proximal duodenum. Electronically Signed   By: Rolm Baptise M.D.   On: 10/26/2020 11:00   Overnight EEG with video  Result Date: 10/27/2020 Lora Havens, MD     10/27/2020 10:19 AM Patient Name: Jesus Osborne MRN: [id number] Epilepsy Attending: Lora Havens Referring Physician/Provider: Dr. Donnetta Simpers Duration: 10/26/2020 0910 to 10/27/2020 0910  Patient history: 40 year old male with altered mental status.  EEG done for seizures.  Level of alertness: asleep  AEDs during EEG study: None  Technical aspects: This EEG study was done with scalp electrodes positioned according to the 10-20 International system of electrode placement. Electrical activity was acquired at a sampling rate of _0  and reviewed with a high frequency filter of _1  and a low frequency filter of _2 . EEG data were recorded continuously and digitally stored.  Description: EEG showed continuous generalized 3 to 5 Hz theta delta slowing. Sleep was characterized by vertex waves, sleep spindles (12 to 14 Hz), maximal frontocentral region. Hyperventilation and photic stimulation were not  performed.    IMPRESSION: This study is suggestive of moderate diffuse encephalopathy, nonspecific etiology. No seizures or epileptiform discharges were seen throughout the recording.  Priyanka O Yadav   [place] EKG SITE RITE  Result Date: 10/27/2020 If Site Rite image not attached, placement could not be confirmed due to current cardiac rhythm.   PHYSICAL EXAM Blood pressure (!) 118/96, pulse 89, temperature (!) 97.5 F (36.4 C), temperature source Oral, resp. rate (!) 21, height _0  (1.778 m), weight (!) 152 kg, SpO2 100 %.  General: sedated and intubated, obese african [place] male, no apparent distress  Lungs: Symmetrical Chest rise, no labored breathing  Cardio: Regular Rate and  Rhythm  Abdomen: Soft, non-tender  Neurological Exam : : Currently sedated and intubated.  Eyes are closed.  Mild skew eye deviation Has Right hypertropia and improved Left Exotropia.  Pupils are small, minimally reactive to light.  Fundi could not be visualized.  Doll's eye movements are sluggish. Minimal movement of Left arm to noxious stimuli Able to move Left leg against gravity to Noxious stimuli immediately spontaneously as well Only has minimal movement in Right foot to Noxious stimuli. Slight left upper extremity movement to sternal rub Plantars both noticeable   ASSESSMENT/PLAN Mr. CAIDIN HEIDENREICH is a 40 y.o. male with history of HTN, prior stroke(about 7 years ago per patient) with mild R sided weakness, uses a cane to walk who presents with R arm numbness and worsening of his R sided weakness. He was sitting at his porch eating salad when he had sudden onset R sided weakness, mostly with his arm and also his leg. He is R handed. Writes with his R hand. Uses a cane but can walk around fine. Reports had a stroke 7 years ago with residual mild R sided weakness.Also endorses shortness of breath since yesterday which has gotten worse today.  Patient is currently intubated and minimally responsive. Will have PICC line placed. Overnight was started on Hypertonic Saline and IV Heparin. Prognosis is poor. Message left for next of kin yesterday but no call back yet. Need to discuss prognosis with them.  Left Superior Cerebellum, Artery of Percheron territory, and Left Frontal Parietal Cortex infarcts secondary to cardiogenic emboli left ventricular clot versus mass   code Stroke CT head - Wedge-shaped hypodensity at the posterior left parietal lobe, consistent with an evolving posterior left MCA distribution infarct. No hemorrhage. ASPECTS is 9. Additional remote lacunar infarcts at the left thalamus and left cerebellum. Focal prominence of the extra-axial space at the bilateral occipital  lobes, indeterminate, but could reflect encephalomalacia related to prior ischemia or other insult versus focally prominent cortical sulcation.    CT Head WO Contrast 3/15 0220- Similar-appearing acute left parietal infarction. No acute intracranial hemorrhage.  Repeat CT Head WO Contrast 3/15 0420 - Acute left frontal parietal infarct. No hemorrhagic conversion or detected progression.  Repeat CT Head WO Contrast 3/15 1948 - Redemonstrated acute/early subacute infarcts within the left frontal and parietal lobes, superior left cerebellum and bilateral ventromedial midbrain and medial thalami. Mild petechial hemorrhage within the left parietal lobe was better appreciated on the brain MRI performed earlier today. There is posterior fossa mass effect with partial effacement of the fourth ventricle but no evidence of obstructive hydrocephalus at this time. No evidence of interval acute infarct.  Repeat CT Head WO Contrast 3/16 0036 - CT perfusion - Negative CTA for emergent large vessel occlusion. 8 cc area of acute core infarct at the posterior left parietal  lobe, corresponding with hypodensity on prior noncontrast head CT. Apparent delayed perfusion seen elsewhere throughout both cerebral and cerebellar hemispheres favored to in large part be artifactual. No definite surrounding ischemic penumbra. Extensive consolidative airspace disease within the partially visualized right greater than left upper lobes, concerning for multifocal pneumonia.  MRI - Acute infarcts in the left superior cerebellum, artery of percheron territory, and left frontal parietal cortex. Petechial hemorrhage in the left parietal region. Remote hypertensive pattern hemorrhages.  2D Echo - EF: 40% There is a large hyperechoic mass (20 x 7 mm) attached to the inferior wall of the left ventricle, towards the apex. It is attached with a narrow base and is moderately mobile. It has increased echogenicity compared to the myocardium. Most  likely this represents a thrombus, but cannot entirely exclude a neoplasm.  EEG - This study is suggestive of moderate diffuse encephalopathy, nonspecific etiology. No seizures or epileptiform discharges were seen throughout the recording.   LDL 52  HgbA1c 8.3  VTE prophylaxis - SCD's        Diet   Diet NPO time specified         No antithrombotic prior to admission, now on Will start IV Heparin at 0000 3/16 due to receiving tPA at 0000 on 3/15.   Therapy recommendations:  Pending  Disposition:  Pending  Hypertension  Home meds:  None  Semi Stable  Currently on Cleviprex   Currently has Labetalol PRN  Permissive hypertension (OK if < 220/120) but gradually normalize in 5-7 days  Long-term BP goal normotensive  Hyperlipidemia  Home meds:  none  LDL 52, goal < 70  Currently not on any medication  Diabetes type II Uncontrolled  Home meds:  None  HgbA1c 8.3, goal < 7.0  CBGs Recent Labs (last 2 labs)         Recent Labs    10/24/2020 2245 10/26/20 0731 10/26/20 1139  GLUCAP 281* 232* 147*    ?    SSI  Other Stroke Risk Factors   Cigarette smoker will advise to stop smoking  Substance abuse - UDS:  THC POSITIVE, Cocaine NONE DETECTED. Patient will be advised to stop using due to stroke risk.  Obesity, Body mass index is 48.08 kg/m., BMI >/= 30 associated with increased stroke risk, recommend weight loss, diet and exercise as appropriate   Hx stroke (approximately 2015)  Other Active Problems  Right Lung Pneumonia- on [facility] day # 1  Fatima Sanger MD Resident I have personally obtained history,examined this patient, reviewed notes, independently viewed imaging studies, participated in medical decision making and plan of care.ROS completed by me personally and pertinent positives fully documented  I have made any additions or clarifications directly to the above note. Agree with note above. . I met with patient's fianc  who arrived after rounds and spoke to her and explained patient's poor prognosis.  Advised her to contact the patient's mother and children as they will need to be involved in making decisions about prolonged life support, tracheostomy, PEG tube and nursing home care which is likely going to need.  She profoundly tears sexually.  Hypertonic saline and supportive care.  IV heparin drip for left ventricular clot.  Patient appears to be in congestive heart failure and will need diuresis discussed with Dr. Lynetta Mare critical care medicine This patient is critically ill and at significant risk of neurological worsening, death and care requires constant monitoring of vital signs, hemodynamics,respiratory and cardiac monitoring, extensive review  of multiple databases, frequent neurological assessment, discussion with family, other specialists and medical decision making of high complexity.I have made any additions or clarifications directly to the above note.This critical care time does not reflect procedure time, or teaching time or supervisory time of PA/NP/Med Resident etc but could involve care discussion time.  I spent 30 minutes of neurocritical care time  in the care of  this patient.      Antony Contras, MD Medical Director [facility] Pager: [phone] 10/27/2020 1:56 PM   To contact Stroke Continuity provider, please refer to [email or web address] After hours, contact General Neurology

## 2020-10-27 NOTE — Progress Notes (Signed)
SLP Cancellation Note  Patient Details Name: OSRIC COUPLAND MRN: [id number] DOB: Nov 15, 1980   Cancelled treatment:        Pt not appropriate for assessments at this time. Will follow   Houston Siren 10/27/2020, 8:47 AM  Orbie Pyo Colvin Caroli.Ed Risk analyst [phone] Office [phone]

## 2020-10-27 NOTE — Progress Notes (Signed)
eLink Physician-Brief Progress Note Patient Name: Jesus Osborne DOB: 10-19-1980 MRN: [id number]   Date of Service  10/27/2020  HPI/Events of Note  Bedside asking for a central line for 3 % saline infusion  + lab draw access, PICC is unavailable.  eICU Interventions  PCCM ground crew requested to place central line.        Frederik Pear 10/27/2020, 8:54 PM

## 2020-10-27 NOTE — Procedures (Signed)
Patient Name: Jesus Osborne  MRN: [id number]  Epilepsy Attending: Lora Havens  Referring Physician/Provider: Dr. Donnetta Simpers Duration: 10/26/2020 0910 to 10/27/2020 0910  Patient history: 40 year old male with altered mental status.  EEG done for seizures.  Level of alertness: asleep  AEDs during EEG study: None  Technical aspects: This EEG study was done with scalp electrodes positioned according to the 10-20 International system of electrode placement. Electrical activity was acquired at a sampling rate of '500Hz'$  and reviewed with a high frequency filter of '70Hz'$  and a low frequency filter of '1Hz'$ . EEG data were recorded continuously and digitally stored.   Description: EEG showed continuous generalized 3 to 5 Hz theta delta slowing. Sleep was characterized by vertex waves, sleep spindles (12 to 14 Hz), maximal frontocentral region. Hyperventilation and photic stimulation were not performed.     IMPRESSION: This study is suggestive of moderate diffuse encephalopathy, nonspecific etiology. No seizures or epileptiform discharges were seen throughout the recording.  Jesus Osborne

## 2020-10-27 NOTE — Procedures (Signed)
Patient Name:Jesus Osborne [id number] Epilepsy Attending:Kosha Jaquith Barbra Sarks Referring Physician/Provider:Dr. Donnetta Simpers Duration: 10/27/2020 0910 to 10/27/2020 1159  Patient history:40 year old male with altered mental status. EEG done for seizures.  Level of alertness:asleep  AEDs during EEG study:None  Technical aspects: This EEG study was done with scalp electrodes positioned according to the 10-20 International system of electrode placement. Electrical activity was acquired at a sampling rate of '500Hz'$  and reviewed with a high frequency filter of '70Hz'$  and a low frequency filter of '1Hz'$ . EEG data were recorded continuously and digitally stored.   Description:EEG showed continuous generalized 3 to 5 Hz theta delta slowing. Sleep was characterized by vertex waves, sleep spindles (12 to 14 Hz), maximal frontocentral region. Hyperventilation and photic stimulation were not performed.   IMPRESSION: This study issuggestive of moderate diffuse encephalopathy, nonspecific etiology.No seizures or epileptiform discharges were seen throughout the recording.  Caley Ciaramitaro Barbra Sarks

## 2020-10-27 NOTE — Progress Notes (Signed)
vLTM EEG complete. No skin breakdown 

## 2020-10-28 LAB — HEPARIN LEVEL (UNFRACTIONATED)
Heparin Unfractionated: <0.1 IU/mL — ABNORMAL LOW (ref 0.30–0.70)
Heparin Unfractionated: <0.1 IU/mL — ABNORMAL LOW (ref 0.30–0.70)
Heparin Unfractionated: 0.19 IU/mL — ABNORMAL LOW (ref 0.30–0.70)

## 2020-10-28 LAB — BASIC METABOLIC PANEL
Anion gap: 5 (ref 5–15)
BUN: 51 mg/dL — ABNORMAL HIGH (ref 6–20)
CO2: 27 mmol/L (ref 22–32)
Calcium: 7.5 mg/dL — ABNORMAL LOW (ref 8.9–10.3)
Chloride: 114 mmol/L — ABNORMAL HIGH (ref 98–111)
Creatinine, Ser: 2.32 mg/dL — ABNORMAL HIGH (ref 0.61–1.24)
GFR, Estimated: 36 mL/min — ABNORMAL LOW (ref >60)
Glucose, Bld: 180 mg/dL — ABNORMAL HIGH (ref 70–99)
Potassium: 4.4 mmol/L (ref 3.5–5.1)
Sodium: 146 mmol/L — ABNORMAL HIGH (ref 135–145)

## 2020-10-28 LAB — CULTURE, RESPIRATORY W GRAM STAIN: Culture: NORMAL

## 2020-10-28 LAB — ANTIPHOSPHOLIPID SYNDROME EVAL, BLD
Anticardiolipin IgA: <9 APL U/mL (ref 0–11)
Anticardiolipin IgG: <9 GPL U/mL (ref 0–14)
Anticardiolipin IgM: <9 MPL U/mL (ref 0–12)
DRVVT: 64.4 s — ABNORMAL HIGH (ref 0.0–47.0)
PTT Lupus Anticoagulant: 56.2 s — ABNORMAL HIGH (ref 0.0–51.9)
Phosphatydalserine, IgA: 2 APS Units (ref 0–19)
Phosphatydalserine, IgG: <9 Units (ref 0–30)
Phosphatydalserine, IgM: <10 Units (ref 0–30)

## 2020-10-28 LAB — GLUCOSE, CAPILLARY
Glucose-Capillary: 182 mg/dL — ABNORMAL HIGH (ref 70–99)
Glucose-Capillary: 169 mg/dL — ABNORMAL HIGH (ref 70–99)
Glucose-Capillary: 174 mg/dL — ABNORMAL HIGH (ref 70–99)
Glucose-Capillary: 182 mg/dL — ABNORMAL HIGH (ref 70–99)
Glucose-Capillary: 156 mg/dL — ABNORMAL HIGH (ref 70–99)
Glucose-Capillary: 127 mg/dL — ABNORMAL HIGH (ref 70–99)

## 2020-10-28 LAB — CBC
HCT: 31.4 % — ABNORMAL LOW (ref 39.0–52.0)
Hemoglobin: 10 g/dL — ABNORMAL LOW (ref 13.0–17.0)
MCH: 25.8 pg — ABNORMAL LOW (ref 26.0–34.0)
MCHC: 31.8 g/dL (ref 30.0–36.0)
MCV: 81.1 fL (ref 80.0–100.0)
Platelets: 440 10*3/uL — ABNORMAL HIGH (ref 150–400)
RBC: 3.87 MIL/uL — ABNORMAL LOW (ref 4.22–5.81)
RDW: 17.2 % — ABNORMAL HIGH (ref 11.5–15.5)
WBC: 12.9 10*3/uL — ABNORMAL HIGH (ref 4.0–10.5)
nRBC: 0.3 % — ABNORMAL HIGH (ref 0.0–0.2)

## 2020-10-28 LAB — HEXAGONAL PHASE PHOSPHOLIPID: Hexagonal Phase Phospholipid: 2 s (ref 0–11)

## 2020-10-28 LAB — PHOSPHORUS
Phosphorus: 4.2 mg/dL (ref 2.5–4.6)
Phosphorus: 4.4 mg/dL (ref 2.5–4.6)
Phosphorus: 4.3 mg/dL (ref 2.5–4.6)

## 2020-10-28 LAB — MAGNESIUM
Magnesium: 2.3 mg/dL (ref 1.7–2.4)
Magnesium: 2.3 mg/dL (ref 1.7–2.4)
Magnesium: 2.2 mg/dL (ref 1.7–2.4)

## 2020-10-28 LAB — SODIUM
Sodium: 143 mmol/L (ref 135–145)
Sodium: 147 mmol/L — ABNORMAL HIGH (ref 135–145)
Sodium: 149 mmol/L — ABNORMAL HIGH (ref 135–145)
Sodium: 151 mmol/L — ABNORMAL HIGH (ref 135–145)

## 2020-10-28 LAB — PTT-LA MIX: PTT-LA Mix: 50.2 s — ABNORMAL HIGH (ref 0.0–48.9)

## 2020-10-28 LAB — DRVVT CONFIRM: dRVVT Confirm: 1.1 ratio (ref 0.8–1.2)

## 2020-10-28 LAB — MPO/PR-3 (ANCA) ANTIBODIES
ANCA Proteinase 3: <3.5 U/mL (ref 0.0–3.5)
Myeloperoxidase Abs: <9 U/mL (ref 0.0–9.0)

## 2020-10-28 LAB — DRVVT MIX: dRVVT Mix: 47.5 s — ABNORMAL HIGH (ref 0.0–40.4)

## 2020-10-28 MED ORDER — AMANTADINE HCL 100 MG PO CAPS
200.0000 mg | ORAL_CAPSULE | Freq: Two times a day (BID) | ORAL | Status: DC
  Administered 2020-10-28: 200 mg
  Filled 2020-10-28 (×2): qty 2

## 2020-10-28 MED ORDER — FUROSEMIDE 10 MG/ML IJ SOLN
40.0000 mg | Freq: Once | INTRAMUSCULAR | Status: AC
  Administered 2020-10-28: 40 mg via INTRAVENOUS
  Filled 2020-10-28: qty 4

## 2020-10-28 MED ORDER — AMANTADINE HCL 50 MG/5ML PO SOLN
200.0000 mg | Freq: Two times a day (BID) | ORAL | Status: DC
  Administered 2020-10-28 – 2020-11-05 (×17): 200 mg
  Filled 2020-10-28 (×18): qty 20

## 2020-10-28 NOTE — Progress Notes (Signed)
OT Cancellation Note  Patient Details Name: Jesus Osborne MRN: [id number] DOB: [birth year]   Cancelled Treatment:    Reason Eval/Treat Not Completed: Active bedrest order;Patient not medically ready  [place], OT/L   Acute OT Clinical Specialist [place] Pager [phone] Office [phone]  10/28/2020, 8:18 AM

## 2020-10-28 NOTE — Progress Notes (Signed)
NAME:  Jesus Osborne, MRN:  [id number], DOB:  1980-10-11, LOS: 2 ADMISSION DATE:  11/01/2020, CONSULTATION DATE:  10/26/20 REFERRING MD:  Lorrin Goodell, CHIEF COMPLAINT:  AMS   Brief History:  Jesus Osborne is a 40 y.o. male who was admitted 3/14 with CVA s/p tPA.  While in [place], had hypoxia requiring NRB then 2L O2 via [place].  Later had change in mental status with minimal response; therefore, intubated before returning to CT.  History of Present Illness:  Pt is encephelopathic; therefore, this HPI is obtained from chart review. Jesus Osborne is a 40 y.o. male who has a PMH including but not limited to prior CVA, HTN, THC and tobacco dependence (see "past medical history" for rest).  He presented to [facility] ED 3/14 with R arm numbness and R sided weakness.  He was taken to CT which was concerning for evolving L MCA infarct.  HE received tPA at 002 (slighly delay due to difficult IV access).  While in CT, he had desaturation requiring NRB and later placed on 2L O2 via [place]. While in ED, later had change in mental status around 0115 with left gaze deviation and decrease in alertness.  Decision made to intubate prior to taking back to CT for repeat scan.  Past Medical History:  has Acute ischemic left MCA stroke ([place]) on their problem list.  Significant Hospital Events:  3/14 > admit. 3/15 > intubated.  Consults:  PCCM  Procedures:  ETT 3/15>  Significant Diagnostic Tests:  -CT head 3/14 (11:22) > evolving posterior L MCA infarct, no hemorrhage, additional remote lacunar infarcts at the left thalamus and left cerebellum. -CTA head / neck 3/14 (12:01)> negative for emergent large vessel occlusoin -CTP 3/14 (12:01)> 8 cc area of acute core infarct at the posterior left parietal lobe -CT head 3/15 (01:56)> no acute intracranial hemorrhage -MRI head 3/15 (06:40)>  1.Acute infarcts in the left left superior cerebellum, artery of [place], and left frontal parietal cortex. 2. Petechial  hemorrhage in the left parietal region. 3. Remote hypertensive pattern hemorrhages. -Echo 3/15>>20x56m moderately mobile mass on inferior wall of the LV, increased echogenicity likely representing thrombus but cannot exclude neoplasm, EF 40% -CT head 3/15>>posterior mass effect with partial effacement of 4th ventricle but no obstructive hydrocephalus -EEG 3/15-3/16>> moderate diffuse encephalopathy. No epileptiform discharges  Micro Data:  Sputum 3/15 > neg RVP 3/15 > neg  Antimicrobials:  Unasyn 3/15 >    Interim History / Subjective:   Overnight continued left upper extremity movement requiring precedex. Unresponsive to verbal or noxious stimuli this morning on 0.4 of Precedex. Movement of left upper extremity when his head was adjusted. Decreased FiO2 to 30% with continued good oxygenation No nasogastric drainage noted today.  Objective   Blood pressure (!) 153/92, pulse 87, temperature 98.7 F (37.1 C), temperature source Axillary, resp. rate (!) 24, height '5\' 10"'$  (1.778 m), weight (!) 152 kg, SpO2 97 %.    Vent Mode: PRVC FiO2 (%):  [40 %] 40 % Set Rate:  [18 bmp] 18 bmp Vt Set:  [580 mL] 580 mL PEEP:  [5 cmH20] 5 cmH20 Plateau Pressure:  [21 cmH20-26 cmH20] 23 cmH20   Intake/Output Summary (Last 24 hours) at 10/28/2020 0802 Last data filed at 10/28/2020 0600 Gross per 24 hour  Intake 1587.05 ml  Output 2650 ml  Net -1062.95 ml   Filed Weights   10/23/2020 2254  Weight: (!) 152 kg    Examination: General: obese male, intubated, unresponsive  on precedex HENT: left and right pupil ~79m, not reactive to light, ET tube in place, MMM Lungs: mechanically ventilation, bilateral inspiratory ronchi Cardiovascular: RRR, normal S1S2, no MRG Abdomen: soft, NABS, slight peau d'orange appearance of lower abdominal wall but improved from yesterday, no pitting edema Extremities: no peripheral edema, 2+ dorsalis pedis pulses, 1+ radial pulses Neuro: Unresponsive to verbal or noxious  stimuli on no sedation, vertical and horizontal dysconjugate gaze, - menace, no movement of left extremities noted during exam GU: foley in place with small amount of urine in bag  Resolved Hospital Problem list   Hyponatremia  Assessment & Plan:   Left Frontal Parietal, Left Superior Cerebellar Infarction s/p TPA LV Inferior Wall Thrombus LV thrombus presumed embolic source. Injury to ventral midbrain extending into the medial thalamus likely contributing to continued unresponsive state. Unclear medical decision maker with multiple family members involved. -consult palliative care for further goals of care and medical decision maker discussion -start amantadine -cont 3% NS at 25 mL/hr, goal Na 150-155, per neuro -q6h Na, most recent Na 147 -goal euthermia, euvolemia, avoid hypotension -follow neuro status and BMP closely -f/u hypercoagulable panel -cont. heparin gtt  Diastolic Congestive Heart Failure, EF 40%, 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 -BNP improved yesterday -Lasix 40 mg today and recheck BNP -cont Coreg 6.25 mg BID -strict I&O, follow BMP  Acute hypoxic Respiratory Failure -Pt unresponsive and unable to protect his airway -PRVC, 8 mL/kg IBW, FiO2 30, Peep 5 -Precedex PRN  Aspiration Pneumonia -normal tracheal aspirate culture, afebrile, and decreasing oxygen requirement -likely pulmonary edema rather than pneumonia -d/c Unasyn  AKI Underlying diabetes with proteinuria on urinalysis indicative of possible diabetic nephropathy -cont IVF -strict I&O, trend sCr -f/u BMP -consider ACEi after improvement in kidney function  HTN -labetalol to maintain SBP<180  Diabetes Mellitus -no history on chart review -SSI resistant coverage -CBGs appropriate overnight -cont Crestor '5mg'$  QD  Best practice (evaluated daily)  Diet: NPO. Pain/Anxiety/Delirium protocol (if indicated): Precedex gtt / Fentanyl PRN. VAP protocol (if indicated): In place. DVT prophylaxis: SCD's. GI prophylaxis:  PPI. Glucose control: SSI. Mobility: Bedrest. Disposition: ICU.  Goals of Care:  Last date of multidisciplinary goals of care discussion: None.  Family and staff present: None. Summary of discussion: None. Follow up goals of care discussion due: 3/21. Code Status:  Full.  Labs   CBC: Recent Labs  Lab 10/15/2020 2254 10/26/20 0006 10/26/20 0203 10/27/20 0322 10/28/20 0442  WBC 15.9*  --   --  12.5* 12.9*  NEUTROABS 13.4*  --   --   --   --   HGB 11.8* 12.6* 12.9* 10.4* 10.0*  HCT 35.6* 37.0* 38.0* 29.7* 31.4*  MCV 77.7*  --   --  79.2* 81.1  PLT 476*  --   --  410* 440*    Basic Metabolic Panel: Recent Labs  Lab 11/04/2020 2254 10/26/20 0006 10/26/20 0203 10/26/20 1633 10/26/20 2124 10/27/20 0322 10/27/20 0952 10/27/20 1531 10/27/20 2314 10/28/20 0442  NA 127* 129* 129* 136   < > 132* 141 144 143 146*  K 3.4* 3.3* 3.6 3.1*  --  5.1  --   --   --  4.4  CL 93* 93*  --  98  --  98  --   --   --  114*  CO2 23  --   --  29  --  23  --   --   --  27  GLUCOSE 289* 284*  --  124*  --  88  --   --   --  180*  BUN 41* 40*  --  39*  --  42*  --   --   --  51*  CREATININE 2.00* 2.00*  --  2.02*  --  2.14*  --   --   --  2.32*  CALCIUM 7.8*  --   --  8.3*  --  7.1*  --   --   --  7.5*  MG  --   --   --   --   --   --  2.3  --  2.3 2.3  PHOS  --   --   --   --   --   --  4.7*  --  4.2 4.4   < > = values in this interval not displayed.   GFR: Estimated Creatinine Clearance: 63.9 mL/min (A) (by C-G formula based on SCr of 2.32 mg/dL (H)). Recent Labs  Lab 10/17/2020 2254 10/26/20 0117 10/27/20 0322 10/28/20 0442  PROCALCITON  --  0.96  --   --   WBC 15.9*  --  12.5* 12.9*    Liver Function Tests: Recent Labs  Lab 10/27/2020 2254  AST 40  ALT 66*  ALKPHOS 207*  BILITOT 1.2  PROT 7.0  ALBUMIN 1.5*   No results for input(s): LIPASE, AMYLASE in the last 168 hours. No results for input(s): AMMONIA in the last 168 hours.  ABG    Component Value Date/Time   PHART  7.369 10/26/2020 0203   PCO2ART 47.0 10/26/2020 0203   PO2ART 81 (L) 10/26/2020 0203   HCO3 27.1 10/26/2020 0203   TCO2 29 10/26/2020 0203   O2SAT 95.0 10/26/2020 0203     Coagulation Profile: Recent Labs  Lab 11/11/2020 2254  INR 1.4*    Cardiac Enzymes: No results for input(s): CKTOTAL, CKMB, CKMBINDEX, TROPONINI in the last 168 hours.  HbA1C: Hgb A1c MFr Bld  Date/Time Value Ref Range Status  10/26/2020 12:45 AM 8.3 (H) 4.8 - 5.6 % Final    Comment:    (NOTE) Pre diabetes:          5.7%-6.4%  Diabetes:              >6.4%  Glycemic control for   <7.0% adults with diabetes     CBG: Recent Labs  Lab 10/27/20 1517 10/27/20 1931 10/27/20 2306 10/28/20 0309 10/28/20 0724  GLUCAP 108* 162* 180* 182* 169*    Review of Systems:   Unable to assess given unresponsive state.  Past Medical History:  He,  has a past medical history of Hypertension and Stroke ([place]).   Surgical History:  History reviewed. No pertinent surgical history.   Social History:   reports that he has never smoked. He has never used smokeless tobacco. He reports that he does not drink alcohol and does not use drugs.   Family History:  His family history is not on file.   Allergies No Known Allergies   Home Medications  Prior to Admission medications   Not on [place], Medical Student 10/28/2020 , 8:02 AM

## 2020-10-28 NOTE — Progress Notes (Signed)
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  No Known Allergies  Patient Measurements: Height: '5\' 10"'$  (177.8 cm) Weight: (!) 152 kg (335 lb 1.6 oz) IBW/kg (Calculated) : 73 Heparin Dosing Weight: 107.5 kg  Vital Signs: Temp: 99.4 F (37.4 C) (03/17 1600) Temp Source: Axillary (03/17 1600) BP: 119/94 (03/17 1800) Pulse Rate: 85 (03/17 1800)  Labs: Recent Labs    10/13/2020 2254 10/26/20 0006 10/26/20 0203 10/26/20 1633 10/27/20 0322 10/27/20 0952 10/27/20 2314 10/28/20 0442 10/28/20 0749 10/28/20 1745  HGB 11.8*   < > 12.9*  --  10.4*  --   --  10.0*  --   --   HCT 35.6*   < > 38.0*  --  29.7*  --   --  31.4*  --   --   PLT 476*  --   --   --  410*  --   --  440*  --   --   APTT 27  --   --   --   --   --   --   --   --   --   LABPROT 16.7*  --   --   --   --   --   --   --   --   --   INR 1.4*  --   --   --   --   --   --   --   --   --   HEPARINUNFRC  --   --   --   --   --    < > <0.10*  --  <0.10* 0.19*  CREATININE 2.00*   < >  --  2.02* 2.14*  --   --  2.32*  --   --    < > = values in this interval not displayed.    Estimated Creatinine Clearance: 63.9 mL/min (A) (by C-G formula based on SCr of 2.32 mg/dL (H)).   Assessment: 66 YOM with history of HTN and CVA presented with acute CVA, s/p tPA on overnight 3/14-3/15. MRI shows acute infarcts with petechial hemorrhage. ECHO on 3/15 shows a large hyperechoic mass in left ventricle, and Pharmacy consulted to dose IV heparin using the stroke protocol. Will be conservative with dosing due to bleeding risk.  Heparin level remains subtherapeutic but increased to 0.19 after rate increase earlier today; no issue with heparin infusion or bleeding reported. Bloody NG output resolved per RN.  Hemoglobin low stable, platelet count elevated.  Goal of Therapy:  Heparin level 0.3-0.5 units/ml with acute CVA Monitor platelets by anticoagulation protocol: Yes   Plan:  No  bolus per stroke protocol - increase heparin infusion to 2400 units/hr Check 6 hr heparin level Monitor daily CBC, s/sx bleeding   Arturo Morton, PharmD, BCPS Please check AMION for all [place] contact numbers Clinical Pharmacist 10/28/2020 7:11 PM

## 2020-10-28 NOTE — Progress Notes (Signed)
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  No Known Allergies  Patient Measurements: Height: '5\' 10"'$  (177.8 cm) Weight: (!) 152 kg (335 lb 1.6 oz) IBW/kg (Calculated) : 73 Heparin Dosing Weight: 110 kg  Vital Signs: Temp: 98.7 F (37.1 C) (03/17 0800) Temp Source: Axillary (03/17 0800) BP: 148/98 (03/17 0711) Pulse Rate: 89 (03/17 0711)  Labs: Recent Labs    10/12/2020 2254 10/26/20 0006 10/26/20 0203 10/26/20 1633 10/27/20 0322 10/27/20 0952 10/27/20 2314 10/28/20 0442 10/28/20 0749  HGB 11.8*   < > 12.9*  --  10.4*  --   --  10.0*  --   HCT 35.6*   < > 38.0*  --  29.7*  --   --  31.4*  --   PLT 476*  --   --   --  410*  --   --  440*  --   APTT 27  --   --   --   --   --   --   --   --   LABPROT 16.7*  --   --   --   --   --   --   --   --   INR 1.4*  --   --   --   --   --   --   --   --   HEPARINUNFRC  --   --   --   --   --  <0.10* <0.10*  --  <0.10*  CREATININE 2.00*   < >  --  2.02* 2.14*  --   --  2.32*  --    < > = values in this interval not displayed.    Estimated Creatinine Clearance: 63.9 mL/min (A) (by C-G formula based on SCr of 2.32 mg/dL (H)).   Assessment: 4 YOM with history of HTN and CVA presented with acute CVA, s/p tPA on overnight 3/14-3/15.  MRI shows acute infarcts with petechial hemorrhage.  ECHO on 3/15 shows a large hyperechoic mass in left ventricle and Pharmacy consulted to dose IV heparin using the stroke protocol.  Will be conservative with dosing due to bleeding risk.  Heparin level undetectable; no issue with heparin infusion.  Bloody NG output resolved per RN.  Hemoglobin low stable, platelet count elevated.  Goal of Therapy:  Heparin level 0.3-0.5 units/ml with acute CVA Monitor platelets by anticoagulation protocol: Yes   Plan:  No bolus per stroke protocol - increase heparin infusion to 2200 units/hr Check 6 hr heparin level Daily heparin level and CBC  Frederika Hukill D.  Mina Marble, PharmD, BCPS, [place] 10/28/2020, 9:42 AM

## 2020-10-28 NOTE — Progress Notes (Signed)
ANTICOAGULATION CONSULT NOTE - Follow Up Consult  Pharmacy Consult for heparin Indication: mobile mass in left heart ventricle in setting of stroke  Labs: Recent Labs    10/13/2020 2254 10/26/20 0006 10/26/20 0203 10/26/20 1633 10/27/20 0322 10/27/20 0952 10/27/20 2314  HGB 11.8* 12.6* 12.9*  --  10.4*  --   --   HCT 35.6* 37.0* 38.0*  --  29.7*  --   --   PLT 476*  --   --   --  410*  --   --   APTT 27  --   --   --   --   --   --   LABPROT 16.7*  --   --   --   --   --   --   INR 1.4*  --   --   --   --   --   --   HEPARINUNFRC  --   --   --   --   --  <0.10* <0.10*  CREATININE 2.00* 2.00*  --  2.02* 2.14*  --   --     Assessment: 39yo male subtherapeutic on heparin after rate change; no gtt issues or signs of bleeding per RN.  Goal of Therapy:  Heparin level 0.3-0.5 units/ml   Plan:  Will increase heparin gtt by 3 units/kg/hr to 1800 units/hr and check level in 6 hours.    Wynona Neat, PharmD, BCPS  10/28/2020,1:48 AM

## 2020-10-28 NOTE — Progress Notes (Addendum)
STROKE TEAM PROGRESS NOTE   INTERVAL HISTORY Had chest X-ray last night post PICC line placement- No complication after right internal jugular catheter placement. Otherwise unremarkable support devices as above. Widespread bilateral airspace disease and possible right effusion, unchanged since prior exam  His aunt is at the bedside.    He is still minimally responsive and is intubated and requiring ventilatory support for respiratory failure. Prognosis continues to be poor. PICC line was placed yesterday and has been getting Hypertonic Saline 3%. Last Na at 1123 was 147. Spoke with aunt about need to have family meeting. Given his stroke if he survives he will need significant care for the remained of his life - feeding tube, help with moving around, 24/7 care. His aunt said that his sister is a Marine scientist at [employer] and hopes she will make "the right" choice. She reports she thinks the patient would not want "this kind of life."   Discussed with CCM that by Monday projection of prognosis will be fairly certain by that time.  Vitals:   10/28/20 0430 10/28/20 0500 10/28/20 0530 10/28/20 0600  BP: (!) 148/99 118/74 (!) 140/113 (!) 153/92  Pulse: 89 88 87 87  Resp: (!) 22 20 (!) 22 (!) 24  Temp:      TempSrc:      SpO2: 98% 98% 97% 97%  Weight:      Height:       CBC:  Recent Labs  Lab 10/30/2020 2254 10/26/20 0006 10/27/20 0322 10/28/20 0442  WBC 15.9*  --  12.5* 12.9*  NEUTROABS 13.4*  --   --   --   HGB 11.8*   < > 10.4* 10.0*  HCT 35.6*   < > 29.7* 31.4*  MCV 77.7*  --  79.2* 81.1  PLT 476*  --  410* 440*   < > = values in this interval not displayed.   Basic Metabolic Panel:  Recent Labs  Lab 10/27/20 0322 10/27/20 0952 10/27/20 2314 10/28/20 0442  NA 132*   < > 143 146*  K 5.1  --   --  4.4  CL 98  --   --  114*  CO2 23  --   --  27  GLUCOSE 88  --   --  180*  BUN 42*  --   --  51*  CREATININE 2.14*  --   --  2.32*  CALCIUM 7.1*  --   --  7.5*  MG  --    < > 2.3  2.3  PHOS  --    < > 4.2 4.4   < > = values in this interval not displayed.   Lipid Panel:  Recent Labs  Lab 10/26/20 0708 10/27/20 0322 10/27/20 0952  CHOL 101  --   --   TRIG 127   < > 104  HDL 24*  --   --   CHOLHDL 4.2  --   --   VLDL 25  --   --   LDLCALC 52  --   --    < > = values in this interval not displayed.   HgbA1c:  Recent Labs  Lab 10/26/20 0045  HGBA1C 8.3*   Urine Drug Screen:  Recent Labs  Lab 10/26/20 0849  LABOPIA NONE DETECTED  COCAINSCRNUR NONE DETECTED  LABBENZ NONE DETECTED  AMPHETMU NONE DETECTED  THCU POSITIVE*  LABBARB NONE DETECTED    Alcohol Level  Recent Labs  Lab 11/01/2020 [place] <10  IMAGING past 24 hours DG CHEST PORT 1 VIEW  Result Date: 10/27/2020 CLINICAL DATA:  Central line placement, intubated, abnormal chest x-ray EXAM: PORTABLE CHEST 1 VIEW COMPARISON:  10/26/2020 FINDINGS: Single frontal view of the chest demonstrates endotracheal tube tip at level of thoracic inlet. Enteric catheter coiled over the gastric fundus, tip excluded by collimation. Right internal jugular catheter tip overlies superior vena cava. Cardiac silhouette is enlarged but stable. Widespread bilateral airspace disease, without significant change since prior study. Right effusion cannot be excluded. No pneumothorax. IMPRESSION: 1. No complication after right internal jugular catheter placement. 2. Otherwise unremarkable support devices as above. 3. Widespread bilateral airspace disease and possible right effusion, unchanged since prior exam. Electronically Signed   By: Randa Ngo M.D.   On: 10/27/2020 22:58   Overnight EEG with video  Result Date: 10/27/2020 Lora Havens, MD     10/27/2020 10:19 AM Patient Name: Jesus Osborne MRN: [id number] Epilepsy Attending: Lora Havens Referring Physician/Provider: Dr. Donnetta Simpers Duration: 10/26/2020 0910 to 10/27/2020 0910  Patient history: 40 year old male with altered mental status.  EEG done for  seizures.  Level of alertness: asleep  AEDs during EEG study: None  Technical aspects: This EEG study was done with scalp electrodes positioned according to the 10-20 International system of electrode placement. Electrical activity was acquired at a sampling rate of '500Hz'$  and reviewed with a high frequency filter of '70Hz'$  and a low frequency filter of '1Hz'$ . EEG data were recorded continuously and digitally stored.  Description: EEG showed continuous generalized 3 to 5 Hz theta delta slowing. Sleep was characterized by vertex waves, sleep spindles (12 to 14 Hz), maximal frontocentral region. Hyperventilation and photic stimulation were not performed.    IMPRESSION: This study is suggestive of moderate diffuse encephalopathy, nonspecific etiology. No seizures or epileptiform discharges were seen throughout the recording.  Priyanka O Yadav   [place] EKG SITE RITE  Result Date: 10/27/2020 If Site Rite image not attached, placement could not be confirmed due to current cardiac rhythm.   PHYSICAL EXAM Blood pressure (!) 153/92, pulse 87, temperature 98.7 F (37.1 C), temperature source Axillary, resp. rate (!) 24, height '5\' 10"'$  (1.778 m), weight (!) 152 kg, SpO2 97 %.  General: sedated and intubated, obese african [place] male, no apparent distress  Lungs: Symmetrical Chest rise, no labored breathing  Cardio: Regular Rate and Rhythm  Abdomen: Soft, non-tender   Neurological Exam : Currently sedated and intubated.Eyes are closed. Mild skew eye deviation. Has Right hypertropia and improved Left Exotropia.  Pupils are small, minimally reactive to light.Fundi could not be visualized.  Doll's eye movements are sluggish. Minimal movement of Left arm to noxious stimuli Able to move Left leg against gravity to Noxious stimuli immediately and rarely spontaneously as well Only has minimal movement in Right leg to Noxious stimuli. Slight left upper extremity movement to sternal rub Plantars both  noticeable    ASSESSMENT/PLAN Jesus Osborne is a 40 y.o. male with history of HTN, prior stroke(about 7 years ago per patient) with mild R sided weakness, uses a cane to walk who presents with R arm numbness and worsening of his R sided weakness. He was sitting at his porch eating salad when he had sudden onset R sided weakness, mostly with his arm and also his leg. He is R handed. Writes with his R hand. Uses a cane but can walk around fine. Reports had a stroke 7 years ago with residual mild R sided weakness.Also endorses  shortness of breath since yesterday which has gotten worse today.  Patient is currently intubated and minimally responsive. Have recommended that family meeting take place to discuss patients wishes as Prognosis is poor. Will not make any changes at this time. Have stopped EEG as overnight revealed no seizure or epileptiform discharges. Discussed Heparin level with Pharmacy and there are adjusting levels as lab results show Heparin is below therapeutic window.  LeftSuperiorCerebellum,Artery ofPercheron territory, andLeftFrontal Parietal Cortexinfarcts secondary tocardiogenic emboli left ventricular clot versus mass   code Stroke CT head-Wedge-shaped hypodensity at the posterior left parietal lobe, consistent with an evolving posterior left MCA distribution infarct. No hemorrhage. ASPECTS is 9. Additional remote lacunar infarcts at the left thalamus and left cerebellum. Focal prominence of the extra-axial space at the bilateral occipital lobes, indeterminate, but could reflect encephalomalacia related to prior ischemia or other insult versus focally prominent cortical sulcation.  CT Head WO Contrast 3/15 0220-Similar-appearing acute left parietal infarction.No acute intracranial hemorrhage.  Repeat CT Head WO Contrast 3/15 0420 -Acute left frontal parietal infarct. No hemorrhagic conversion or detected progression.  Repeat CT Head WO Contrast 3/15 1948 -  Redemonstrated acute/early subacute infarcts within the left frontal and parietal lobes, superior left cerebellum and bilateral ventromedial midbrain and medial thalami. Mild petechial hemorrhage within the left parietal lobe was better appreciated on the brain MRI performed earlier today. There is posterior fossa mass effect with partial effacement of the fourth ventricle but no evidence of obstructive hydrocephalus at this time. No evidence of interval acute infarct.  Repeat CT Head WO Contrast 3/16 0036 - CT perfusion-Negative CTA for emergent large vessel occlusion. 8 cc area of acute core infarct at the posterior left parietal lobe, corresponding with hypodensity on prior noncontrast head CT. Apparent delayed perfusion seen elsewhere throughout both cerebral and cerebellar hemispheres favored to in large part be artifactual. No definite surrounding ischemic penumbra. Extensive consolidative airspace disease within the partially visualized right greater than left upper lobes, concerning for multifocal pneumonia.  MRI-Acute infarcts in the left superior cerebellum, artery of percheron territory, and left frontal parietal cortex. Petechial hemorrhage in the left parietal region. Remote hypertensive pattern hemorrhages.  2D Echo- EF: 40%There is a large hyperechoic mass (20 x 7 mm) attached to the inferior wall of the left ventricle, towards the apex. It is attached with a narrowbase and is moderately mobile. It has increased echogenicity compared tothe myocardium. Most likely this represents a thrombus, but cannot entirely exclude a neoplasm.  EEG - This study is suggestive of moderate diffuse encephalopathy, nonspecific etiology. No seizures or epileptiform discharges were seen throughout the recording.  [id number]  HgbA1c8.3  VTE prophylaxis -SCD's       Diet   Diet NPO time specified         No antithromboticprior to admission, now on Will start IV Heparin at 0000 3/16 due  to receiving tPA at 0000 on 3/15.   Therapy recommendations:Pending  Disposition:Pending  Hypertension  Home meds:None  SemiStable  Currently on Cleviprex   Currently has Labetalol PRN  Permissive hypertension (OK if < 220/120) but gradually normalize in 5-7 days  Long-term BP goal normotensive  Hyperlipidemia  Home meds:none  LDL 52, goal < 70  Currently not on any medication  Diabetes type II Uncontrolled  Home meds:None  HgbA1c 8.3, goal < 7.0  CBGs    Recent Labs (last 2 labs)          Recent Labs   10/17/2020 2245 10/26/20 0731 10/26/20 1139  GLUCAP 281* 232* 147*    ?   SSI  Other Stroke Risk Factors  Cigarette smokerwilladvise to stop smoking  Substance abuse - UDS: THC POSITIVE, Cocaine NONE DETECTED. Patientwill beadvised to stop using    Obesity,Body mass index is 48.08 kg/m., BMI >/= 30 associated with increased stroke risk, recommend weight loss, diet and exercise as appropriate   Hx stroke(approximately 2015)  Other Active Problems  Right Lung Pneumonia- on [facility] day # 2  Fatima Sanger MD Resident Patient remains critically ill with respiratory and cardiac failure requiring full ventilatory and hemodynamic support.  Remains on hypertonic saline cytotoxic edema and on IV heparin to prevent embolization from LV clot.  I had a long discussion with the patient's aunt at the bedside poor general condition and prognosis and answered questions.  I recommend family meeting with the patient's sister children establish goals of care as he is unlikely to survive with prolonged  tracheostomy, PEG tube and 24-hour nursing care at home.  Discussed with Dr. Lynetta Mare critical care medicine This patient is critically ill and at significant risk of neurological worsening, death and care requires constant monitoring of vital signs, hemodynamics,respiratory and cardiac monitoring, extensive review of multiple  databases, frequent neurological assessment, discussion with family, other specialists and medical decision making of high complexity.I have made any additions or clarifications directly to the above note.This critical care time does not reflect procedure time, or teaching time or supervisory time of PA/NP/Med Resident etc but could involve care discussion time.  I spent 35 minutes of neurocritical care time  in the care of  this patient.  Antony Contras, MD  To contact Stroke Continuity provider, please refer to [email or web address] After hours, contact General Neurology

## 2020-10-28 NOTE — Progress Notes (Signed)
PT Cancellation Note  Patient Details Name: Jesus Osborne MRN: [id number] DOB: April 27, 1981   Cancelled Treatment:    Reason Eval/Treat Not Completed: Patient not medically ready;Active bedrest order. RN requesting hold on PT at this time. Will follow-up another day as appropriate.  Moishe Spice, PT, DPT Acute Rehabilitation Services  Pager: [phone] Office: [place] 10/28/2020, 8:39 AM

## 2020-10-29 ENCOUNTER — Inpatient Hospital Stay (HOSPITAL_COMMUNITY): Payer: Medicaid Other

## 2020-10-29 LAB — BASIC METABOLIC PANEL
Anion gap: 9 (ref 5–15)
BUN: 57 mg/dL — ABNORMAL HIGH (ref 6–20)
CO2: 25 mmol/L (ref 22–32)
Calcium: 7.8 mg/dL — ABNORMAL LOW (ref 8.9–10.3)
Chloride: 117 mmol/L — ABNORMAL HIGH (ref 98–111)
Creatinine, Ser: 2.47 mg/dL — ABNORMAL HIGH (ref 0.61–1.24)
GFR, Estimated: 33 mL/min — ABNORMAL LOW (ref >60)
Glucose, Bld: 163 mg/dL — ABNORMAL HIGH (ref 70–99)
Potassium: 4.1 mmol/L (ref 3.5–5.1)
Sodium: 151 mmol/L — ABNORMAL HIGH (ref 135–145)

## 2020-10-29 LAB — CBC
HCT: 32.7 % — ABNORMAL LOW (ref 39.0–52.0)
Hemoglobin: 10.4 g/dL — ABNORMAL LOW (ref 13.0–17.0)
MCH: 26.3 pg (ref 26.0–34.0)
MCHC: 31.8 g/dL (ref 30.0–36.0)
MCV: 82.6 fL (ref 80.0–100.0)
Platelets: 402 10*3/uL — ABNORMAL HIGH (ref 150–400)
RBC: 3.96 MIL/uL — ABNORMAL LOW (ref 4.22–5.81)
RDW: 17.3 % — ABNORMAL HIGH (ref 11.5–15.5)
WBC: 14.3 10*3/uL — ABNORMAL HIGH (ref 4.0–10.5)
nRBC: 0.2 % (ref 0.0–0.2)

## 2020-10-29 LAB — GLUCOSE, CAPILLARY
Glucose-Capillary: 156 mg/dL — ABNORMAL HIGH (ref 70–99)
Glucose-Capillary: 162 mg/dL — ABNORMAL HIGH (ref 70–99)
Glucose-Capillary: 136 mg/dL — ABNORMAL HIGH (ref 70–99)
Glucose-Capillary: 173 mg/dL — ABNORMAL HIGH (ref 70–99)
Glucose-Capillary: 152 mg/dL — ABNORMAL HIGH (ref 70–99)
Glucose-Capillary: 175 mg/dL — ABNORMAL HIGH (ref 70–99)

## 2020-10-29 LAB — BRAIN NATRIURETIC PEPTIDE: B Natriuretic Peptide: 1584.2 pg/mL — ABNORMAL HIGH (ref 0.0–100.0)

## 2020-10-29 LAB — HEPARIN LEVEL (UNFRACTIONATED)
Heparin Unfractionated: 0.42 IU/mL (ref 0.30–0.70)
Heparin Unfractionated: 0.58 IU/mL (ref 0.30–0.70)
Heparin Unfractionated: 0.48 IU/mL (ref 0.30–0.70)

## 2020-10-29 LAB — SODIUM
Sodium: 152 mmol/L — ABNORMAL HIGH (ref 135–145)
Sodium: 154 mmol/L — ABNORMAL HIGH (ref 135–145)
Sodium: 153 mmol/L — ABNORMAL HIGH (ref 135–145)

## 2020-10-29 MED ORDER — DOCUSATE SODIUM 50 MG/5ML PO LIQD: 100.0000 mg | Freq: Two times a day (BID) | ORAL | Status: DC | PRN

## 2020-10-29 MED ORDER — POLYETHYLENE GLYCOL 3350 17 G PO PACK: 17.0000 g | PACK | Freq: Every day | ORAL | Status: DC | PRN

## 2020-10-29 NOTE — Progress Notes (Signed)
SLP Cancellation Note  Patient Details Name: Jesus Osborne MRN: [id number] DOB: [birth year]   Cancelled treatment:       Reason Eval/Treat Not Completed: Patient not medically ready (remains on vent, requiring sedation). Will plan to f/u next week. Please contact weekend pager [phone]) if needed sooner.     Osie Bond., M.A. [place] Acute Rehabilitation Services Pager [phone] Office [phone]  10/29/2020, 7:12 AM

## 2020-10-29 NOTE — Progress Notes (Signed)
NAME:  Jesus Osborne, MRN:  [id number], DOB:  11/02/1980, LOS: 3 ADMISSION DATE:  11/11/2020, CONSULTATION DATE:  10/26/20 REFERRING MD:  Lorrin Goodell, CHIEF COMPLAINT:  AMS   Brief History:  Jesus Osborne is a 40 y.o. male who was admitted 3/14 with CVA s/p tPA.  While in [place], had hypoxia requiring NRB then 2L O2 via [place].  Later had change in mental status with minimal response; therefore, intubated before returning to CT.  History of Present Illness:  Pt is encephelopathic; therefore, this HPI is obtained from chart review. Jesus Osborne is a 40 y.o. male who has a PMH including but not limited to prior CVA, HTN, THC and tobacco dependence (see "past medical history" for rest).  He presented to [facility] ED 3/14 with R arm numbness and R sided weakness.  He was taken to CT which was concerning for evolving L MCA infarct.  HE received tPA at 002 (slighly delay due to difficult IV access).  While in CT, he had desaturation requiring NRB and later placed on 2L O2 via [place]. While in ED, later had change in mental status around 0115 with left gaze deviation and decrease in alertness.  Decision made to intubate prior to taking back to CT for repeat scan.  Past Medical History:  has Acute ischemic left MCA stroke ([place]) on their problem list.  Significant Hospital Events:  3/14 > admit. 3/15 > intubated  Consults:  PCCM  Procedures:  ETT 3/15>  Significant Diagnostic Tests:  -CT head 3/14 (11:22) > evolving posterior L MCA infarct, no hemorrhage, additional remote lacunar infarcts at the left thalamus and left cerebellum. -CTA head / neck 3/14 (12:01)> negative for emergent large vessel occlusoin -CTP 3/14 (12:01)> 8 cc area of acute core infarct at the posterior left parietal lobe -CT head 3/15 (01:56)> no acute intracranial hemorrhage -MRI head 3/15 (06:40)>  1.Acute infarcts in the left left superior cerebellum, artery of percheron territory, and left frontal parietal cortex. 2. Petechial  hemorrhage in the left parietal region. 3. Remote hypertensive pattern hemorrhages. -Echo 3/15>>20x31m moderately mobile mass on inferior wall of the LV, increased echogenicity likely representing thrombus but cannot exclude neoplasm, EF 40% -CT head 3/15>>posterior mass effect with partial effacement of 4th ventricle but no obstructive hydrocephalus -EEG 3/15-3/16>> moderate diffuse encephalopathy. No epileptiform discharges -CT head 3/18>> worsened mass effect almost entirely effacing 4th ventricle with increased ventriculomegaly  Micro Data:  Sputum 3/15 > neg RVP 3/15 > neg  Antimicrobials:  Unasyn 3/15 >    Interim History / Subjective:   Overnight continued left upper extremity movement and tachypnea if weaned from sedation. Noted to move LUE, LLE, and RLE yesterday during ETT suctioning. Responds to verbal stimuli by opening eyes this morning. Wiggles left toes on command. FiO2 30%, Peep 5. Failed PSV this AM. No nasogastric drainage noted today.  Objective   Blood pressure (!) 117/94, pulse 79, temperature 99.3 F (37.4 C), temperature source Axillary, resp. rate 18, height '5\' 10"'$  (1.778 m), weight (!) 144.7 kg, SpO2 100 %.    Vent Mode: PRVC FiO2 (%):  [30 %] 30 % Set Rate:  [18 bmp] 18 bmp Vt Set:  [580 mL] 580 mL PEEP:  [5 cmH20] 5 cmH20 Plateau Pressure:  [14 cmH20-26 cmH20] 16 cmH20   Intake/Output Summary (Last 24 hours) at 10/29/2020 0858 Last data filed at 10/29/2020 0600 Gross per 24 hour  Intake 3326.76 ml  Output 2925 ml  Net 401.76 ml  Filed Weights   11/04/2020 2254 10/29/20 0400  Weight: (!) 152 kg (!) 144.7 kg    Examination: General: obese male, intubated, sedated on precedex HENT: left and right pupil ~65m, ET tube in place, dry MMM Lungs: mechanically ventilated, faint bilateral inspiratory ronchi Cardiovascular: RRR, normal S1S2, no MRG Abdomen: soft, NABS, peau d'orange appearance of pannus with 1+ pitting edema Extremities: no peripheral  edema, 2+ dorsalis pedis pulses, 1+ radial pulses Neuro: Responds to verbal stimuli by opening eyes, vertical and horizontal dysconjugate gaze with some eye movement, moves left toes on command, no movement of other extremities  Resolved Hospital Problem list   Hyponatremia  Assessment & Plan:   Left Frontal Parietal, Left Superior Cerebellar Infarction s/p TPA LV Inferior Wall Thrombus LV thrombus presumed embolic source. Injury to ventral midbrain extending into the medial thalamus likely contributing to continued unresponsive state. -possible increased edema and hydrocephalus on CT today but improved clinical exam -repeat CT with neurosurg consult if worsened exam -consult palliative care -cont amantadine -reduce Na goal to 145-150 -hold 3% saline pending next Na level -q6h Na, most recent Na 152 -follow neuro status and BMP closely -f/u hypercoagulable panel -cont. heparin gtt  Diastolic Congestive Heart Failure, EF 40%, 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 -BNP elevated to 1,584 today from 664 on 3/16 -question 2/2 heart failure exacerbation with hypertonic saline or worsening SCA edema -reduced Na goal to 145-150 -hold lasix with worsening kidney function -cont Coreg 6.25 mg BID -strict I&O, follow BMP and BNP in AM  Acute hypoxic Respiratory Failure -pt minimally responsive on exam -failed SBT this morning -cont. PRVC, 8 mL/kg IBW, FiO2 30, Peep 5 -wean Precedex as able to  Bilateral Pulmonary Infiltrates -normal tracheal aspirate culture, afebrile, and little oxygen requirement -likely pulmonary edema rather than pneumonia on CXR -d/c Unasyn  AKI Underlying diabetes with proteinuria on urinalysis indicative of possible diabetic nephropathy -sCr rising to 2.47 from 2.0 at admission -BUN/Cr>20, likely prerenal, however questionable peptic ulcer may have elevated BUN -hold lasix -consider renal imaging if continued elevation tomorrow -strict I&O -f/u BMP  HTN -labetalol to maintain  SBP<180  Diabetes Mellitus -no history on chart review -SSI resistant coverage -CBGs appropriate last 24 hrs -cont Crestor '5mg'$  QD  Best practice (evaluated daily)  Diet: NPO. Pain/Anxiety/Delirium protocol (if indicated): Precedex gtt / Fentanyl PRN. VAP protocol (if indicated): In place. DVT prophylaxis: SCD's. GI prophylaxis: PPI. Glucose control: SSI. Mobility: Bedrest. Disposition: ICU.  Goals of Care:  Last date of multidisciplinary goals of care discussion: None.  Family and staff present: None. Summary of discussion: None. Follow up goals of care discussion due: 3/21. Code Status:  Full.  Labs   CBC: Recent Labs  Lab 10/27/2020 2254 10/26/20 0006 10/26/20 0203 10/27/20 0322 10/28/20 0442 10/29/20 0305  WBC 15.9*  --   --  12.5* 12.9* 14.3*  NEUTROABS 13.4*  --   --   --   --   --   HGB 11.8* 12.6* 12.9* 10.4* 10.0* 10.4*  HCT 35.6* 37.0* 38.0* 29.7* 31.4* 32.7*  MCV 77.7*  --   --  79.2* 81.1 82.6  PLT 476*  --   --  410* 440* 402*    Basic Metabolic Panel: Recent Labs  Lab 11/03/2020 2254 10/26/20 0006 10/26/20 0203 10/26/20 1633 10/26/20 2124 10/27/20 0322 10/27/20 0952 10/27/20 1531 10/27/20 2314 10/28/20 0442 10/28/20 1002 10/28/20 1543 10/28/20 1745 10/28/20 2105 10/29/20 0305  NA 127* 129* 129* 136   < > 132* 141   < >  143 146* 147*  --  149* 151* 151*  K 3.4* 3.3* 3.6 3.1*  --  5.1  --   --   --  4.4  --   --   --   --  4.1  CL 93* 93*  --  98  --  98  --   --   --  114*  --   --   --   --  117*  CO2 23  --   --  29  --  23  --   --   --  27  --   --   --   --  25  GLUCOSE 289* 284*  --  124*  --  88  --   --   --  180*  --   --   --   --  163*  BUN 41* 40*  --  39*  --  42*  --   --   --  51*  --   --   --   --  57*  CREATININE 2.00* 2.00*  --  2.02*  --  2.14*  --   --   --  2.32*  --   --   --   --  2.47*  CALCIUM 7.8*  --   --  8.3*  --  7.1*  --   --   --  7.5*  --   --   --   --  7.8*  MG  --   --   --   --   --   --  2.3  --  2.3  2.3  --  2.2  --   --   --   PHOS  --   --   --   --   --   --  4.7*  --  4.2 4.4  --  4.3  --   --   --    < > = values in this interval not displayed.   GFR: Estimated Creatinine Clearance: 58.3 mL/min (A) (by C-G formula based on SCr of 2.47 mg/dL (H)). Recent Labs  Lab 10/22/2020 2254 10/26/20 0117 10/27/20 0322 10/28/20 0442 10/29/20 0305  PROCALCITON  --  0.96  --   --   --   WBC 15.9*  --  12.5* 12.9* 14.3*    Liver Function Tests: Recent Labs  Lab 11/01/2020 2254  AST 40  ALT 66*  ALKPHOS 207*  BILITOT 1.2  PROT 7.0  ALBUMIN 1.5*   No results for input(s): LIPASE, AMYLASE in the last 168 hours. No results for input(s): AMMONIA in the last 168 hours.  ABG    Component Value Date/Time   PHART 7.369 10/26/2020 0203   PCO2ART 47.0 10/26/2020 0203   PO2ART 81 (L) 10/26/2020 0203   HCO3 27.1 10/26/2020 0203   TCO2 29 10/26/2020 0203   O2SAT 95.0 10/26/2020 0203     Coagulation Profile: Recent Labs  Lab 10/26/2020 2254  INR 1.4*    Cardiac Enzymes: No results for input(s): CKTOTAL, CKMB, CKMBINDEX, TROPONINI in the last 168 hours.  HbA1C: Hgb A1c MFr Bld  Date/Time Value Ref Range Status  10/26/2020 12:45 AM 8.3 (H) 4.8 - 5.6 % Final    Comment:    (NOTE) Pre diabetes:          5.7%-6.4%  Diabetes:              >6.4%  Glycemic control for   <7.0%  adults with diabetes     CBG: Recent Labs  Lab 10/28/20 1511 10/28/20 1907 10/28/20 2328 10/29/20 0322 10/29/20 0731  GLUCAP 182* 156* 127* 156* 162*    Review of Systems:   Unable to assess given unresponsive state.  Past Medical History:  He,  has a past medical history of Hypertension and Stroke ([place]).   Surgical History:  History reviewed. No pertinent surgical history.   Social History:   reports that he has never smoked. He has never used smokeless tobacco. He reports that he does not drink alcohol and does not use drugs.   Family History:  His family history is not on file.    Allergies No Known Allergies   Home Medications  Prior to Admission medications   Not on [place], Medical Student 10/29/2020 , 8:58 AM

## 2020-10-29 NOTE — Care Management (Signed)
Transitions of Care Team following this patient for discharge planning.  Currently, patient not medically stable; will continue to follow as patient progresses.    Reinaldo Raddle, RN, BSN  Trauma/Neuro ICU Case Manager [phone]

## 2020-10-29 NOTE — Progress Notes (Signed)
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  No Known Allergies  Patient Measurements: Height: '5\' 10"'$  (177.8 cm) Weight: (!) 144.7 kg (319 lb 0.1 oz) IBW/kg (Calculated) : 73 Heparin Dosing Weight: 107.5 kg  Vital Signs: Temp: 99.3 F (37.4 C) (03/18 0800) Temp Source: Axillary (03/18 0800) BP: 135/112 (03/18 1200) Pulse Rate: 86 (03/18 1200)  Labs: Recent Labs    10/27/20 0322 10/27/20 0952 10/28/20 0442 10/28/20 0749 10/28/20 1745 10/29/20 0305 10/29/20 0345 10/29/20 1217  HGB 10.4*  --  10.0*  --   --  10.4*  --   --   HCT 29.7*  --  31.4*  --   --  32.7*  --   --   PLT 410*  --  440*  --   --  402*  --   --   HEPARINUNFRC  --    < >  --    < > 0.19*  --  0.42 0.58  CREATININE 2.14*  --  2.32*  --   --  2.47*  --   --    < > = values in this interval not displayed.    Estimated Creatinine Clearance: 58.3 mL/min (A) (by C-G formula based on SCr of 2.47 mg/dL (H)).   Assessment: 69 YOM with history of HTN and CVA presented with acute CVA, s/p tPA on overnight 3/14-3/15. MRI shows acute infarcts with petechial hemorrhage. ECHO on 3/15 shows a large hyperechoic mass in left ventricle, and Pharmacy consulted to dose IV heparin using the stroke protocol. Will be conservative with dosing due to bleeding risk.  Heparin level slightly supratherapeutic at 0.58 on 2400 units/hr; no issue with heparin infusion or bleeding reported. Hemoglobin low stable, platelet count elevated.  Goal of Therapy:  Heparin level 0.3-0.5 units/ml with acute CVA Monitor platelets by anticoagulation protocol: Yes   Plan:  No bolus per stroke protocol - decrease heparin infusion to 2350 units/hr Check 6 hr heparin level Monitor daily CBC, s/sx bleeding   Alanda Slim, PharmD, [facility] Clinical Pharmacist Please see AMION for all Pharmacists' Contact Phone Numbers 10/29/2020, 1:25 PM

## 2020-10-29 NOTE — Progress Notes (Addendum)
STROKE TEAM PROGRESS NOTE   INTERVAL HISTORY No acute events overnight. He remains on ventilatory support remains on hypertonic saline serum sodium is at goal at 151 He is intubated. He is more reactive to noxious stimuli today. After being aroused he was moving his Left arm some and moved his left leg to stimuli. He was able to open his eyes and moved them a little but still has little other response. Dr. Leonie Man had a family meeting yesterday evening about his poor prognosis. CT scan of the head today shows stable cytotoxic edema cerebellar stroke with slightly more effacement of fourth ventricle mild hydrocephalus.  CCM is following  Vitals:   10/29/20 0305 10/29/20 0400 10/29/20 0500 10/29/20 0600  BP:  112/86 111/86 (!) 119/97  Pulse: 78 78 77 82  Resp: (!) 21 (!) '21 18 15  '$ Temp:  98.7 F (37.1 C)    TempSrc:  Oral    SpO2: 100% 100% 100% 100%  Weight:  (!) 144.7 kg    Height:       CBC:  Recent Labs  Lab 10/31/2020 2254 10/26/20 0006 10/28/20 0442 10/29/20 0305  WBC 15.9*   < > 12.9* 14.3*  NEUTROABS 13.4*  --   --   --   HGB 11.8*   < > 10.0* 10.4*  HCT 35.6*   < > 31.4* 32.7*  MCV 77.7*   < > 81.1 82.6  PLT 476*   < > 440* 402*   < > = values in this interval not displayed.   Basic Metabolic Panel:  Recent Labs  Lab 10/28/20 0442 10/28/20 1002 10/28/20 1543 10/28/20 1745 10/28/20 2105 10/29/20 0305  NA 146*   < >  --    < > 151* 151*  K 4.4  --   --   --   --  4.1  CL 114*  --   --   --   --  117*  CO2 27  --   --   --   --  25  GLUCOSE 180*  --   --   --   --  163*  BUN 51*  --   --   --   --  57*  CREATININE 2.32*  --   --   --   --  2.47*  CALCIUM 7.5*  --   --   --   --  7.8*  MG 2.3  --  2.2  --   --   --   PHOS 4.4  --  4.3  --   --   --    < > = values in this interval not displayed.   Lipid Panel:  Recent Labs  Lab 10/26/20 0708 10/27/20 0322 10/27/20 0952  CHOL 101  --   --   TRIG 127   < > 104  HDL 24*  --   --   CHOLHDL 4.2  --   --    VLDL 25  --   --   LDLCALC 52  --   --    < > = values in this interval not displayed.   HgbA1c:  Recent Labs  Lab 10/26/20 0045  HGBA1C 8.3*   Urine Drug Screen:  Recent Labs  Lab 10/26/20 0849  LABOPIA NONE DETECTED  COCAINSCRNUR NONE DETECTED  LABBENZ NONE DETECTED  AMPHETMU NONE DETECTED  THCU POSITIVE*  LABBARB NONE DETECTED    Alcohol Level  Recent Labs  Lab 10/20/2020 [place] <10  IMAGING past 24 hours CT Head Wo Contrast  Result Date: 10/29/2020 CLINICAL DATA:  Follow-up examination for acute stroke. EXAM: CT HEAD WITHOUT CONTRAST TECHNIQUE: Contiguous axial images were obtained from the base of the skull through the vertex without intravenous contrast. COMPARISON:  Prior head CT from 10/27/2020 as well as multiple previous studies. FINDINGS: Brain: There has been continued interval evolution of the ischemic infarcts involving the superior left cerebellum, ventromedial thalami, and left parietal lobe. Associated mild petechial hemorrhage at the left parietal region without malignant hemorrhagic transformation or significant regional mass effect, similar to previous. Increased edema seen about the left SCA and artery of Percheron territory infarcts as compared to previous. Associated mass effect on the adjacent left perimesencephalic cistern and fourth ventricle which is now nearly completely effaced, worsened from previous (series 3, image 14). There is slightly increased ventriculomegaly as compared to previous exam, worrisome for developing hydrocephalus. No other new areas of large vessel territory infarct identified. No other acute intracranial hemorrhage. No mass lesion or midline shift or visible extra-axial fluid collection. Vascular: No hyperdense vessel. Skull: Scalp soft tissues and calvarium within normal limits. Sinuses/Orbits: Globes and orbital soft tissues demonstrate no acute finding. Mild mucosal thickening noted within the left sphenoid sinus. Mastoid air  cells remain clear. Endotracheal and enteric tubes partially visualized. Other: None. IMPRESSION: 1. Continued interval evolution of multiple ischemic infarcts involving the superior left cerebellum, ventromedial thalami, and left parietal lobe. Increased edema about the left SCA infarct with worsened mass effect on the adjacent fourth ventricle which is now almost entirely effaced. Slightly increased ventriculomegaly as compared to previous worrisome for developing hydrocephalus. 2. Associated mild petechial hemorrhage at the left parietal infarct without malignant hemorrhagic transformation or significant regional mass effect, similar to previous. 3. No other new acute intracranial abnormality. Electronically Signed   By: Jeannine Boga M.D.   On: 10/29/2020 03:44    PHYSICAL EXAM Blood pressure (!) 119/97, pulse 82, temperature 98.7 F (37.1 C), temperature source Oral, resp. rate 15, height '5\' 10"'$  (1.778 m), weight (!) 144.7 kg, SpO2 100 %.  General:sedated and intubated,obese african [place] male, no apparent distress  Lungs: Symmetrical Chest rise, no labored breathing  Cardio: Regular Rate and Rhythm  Abdomen: Soft, non-tender   Neurological Exam:Intubated.Able to open eyes on own after sedation was turned off. Mild skew eye deviation. Has Right hypertropia with mild Exotropia developing, andimprovedLeft Exotropia.  Pupils are small, minimally reactive to light.Fundi could not be visualized. Doll's eye movements are sluggish. Minimal movement of Left arm to noxious stimuli Able to move Left leg against gravity to Noxious stimuliimmediately and rarely spontaneously as well Moderate Left upper extremity movement to sternal rub Plantars both noticeable   ASSESSMENT/PLAN Jesus Osborne is a 40 y.o. male with history of HTN, prior stroke(about 7 years ago per patient) with mild R sided weakness, uses a cane to walk who presents with R arm numbness and worsening  of his R sided weakness. He was sitting at his porch eating salad when he had sudden onset R sided weakness, mostly with his arm and also his leg. He is R handed. Writes with his R hand. Uses a cane but can walk around fine. Reports had a stroke 7 years ago with residual mild R sided weakness.Also endorses shortness of breath since yesterday which has gotten worse today.  Patient is currently intubated and more responsive today. Able to open eyes but only had some movement in Left extremities. Heparin level is  within therapeutic window. Will continue to monitor improvements. Will have clear picture of potential improvement by Monday. And that point another family meeting would be helpful in determining next steps.  LeftSuperiorCerebellum,Artery ofPercheron territory, andLeftFrontal Parietal Cortexinfarcts secondary tocardiogenic emboli left ventricular clot versus mass   code Stroke CT head-Wedge-shaped hypodensity at the posterior left parietal lobe, consistent with an evolving posterior left MCA distribution infarct. No hemorrhage. ASPECTS is 9. Additional remote lacunar infarcts at the left thalamus and left cerebellum. Focal prominence of the extra-axial space at the bilateral occipital lobes, indeterminate, but could reflect encephalomalacia related to prior ischemia or other insult versus focally prominent cortical sulcation.  CT Head WO Contrast3/150220-Similar-appearing acute left parietal infarction.No acute intracranial hemorrhage.  Repeat CT Head WO Contrast3/150420 -Acute left frontal parietal infarct. No hemorrhagic conversion or detected progression.  Repeat CT Head WO Contrast 3/15 1948 -Redemonstrated acute/early subacute infarcts within the left frontal and parietal lobes, superior left cerebellum and bilateral ventromedial midbrain and medial thalami. Mild petechial hemorrhage within the left parietal lobe was better appreciated on the brain MRI performed earlier  today. There is posterior fossa mass effect with partial effacement of the fourth ventricle but no evidence of obstructive hydrocephalus at this time. No evidence of interval acute infarct.  Repeat CT Head WO Contrast 3/16 0036 -CT perfusion-Negative CTA for emergent large vessel occlusion. 8 cc area of acute core infarct at the posterior left parietal lobe, corresponding with hypodensity on prior noncontrast head CT. Apparent delayed perfusion seen elsewhere throughout both cerebral and cerebellar hemispheres favored to in large part be artifactual. No definite surrounding ischemic penumbra. Extensive consolidative airspace disease within the partially visualized right greater than left upper lobes, concerning for multifocal pneumonia.  MRI-Acute infarcts in the left superior cerebellum, artery of percheron territory, and left frontal parietal cortex. Petechial hemorrhage in the left parietal region. Remote hypertensive pattern hemorrhages.  2D Echo- EF: 40%There is a large hyperechoic mass (20 x 7 mm) attached to the inferior wall of the left ventricle, towards the apex. It is attached with a narrowbase and is moderately mobile. It has increased echogenicity compared tothe myocardium. Most likely this represents a thrombus, but cannot entirely exclude a neoplasm.  EEG - This study is suggestive of moderate diffuse encephalopathy, nonspecific etiology. No seizures or epileptiform discharges were seen throughout the recording.  [id number]  HgbA1c8.3  VTE prophylaxis -SCD's       Diet   Diet NPO time specified         No antithromboticprior to admission, now on Will start IV Heparin at 0000 3/16 due to receiving tPA at 0000 on 3/15.   Therapy recommendations:Pending  Disposition:Pending  Hypertension  Home meds:None  SemiStable  Currently on Cleviprex   Currently has Labetalol PRN  Permissive hypertension (OK if < 220/120) but gradually normalize in 5-7  days  Long-term BP goal normotensive  Hyperlipidemia  Home meds:none  LDL 52, goal < 70  Currently not on any medication  Diabetes type II Uncontrolled  Home meds:None  HgbA1c 8.3, goal < 7.0  CBGs         Recent Labs (last 2 labs)           Recent Labs   10/21/2020 2245 10/26/20 0731 10/26/20 1139  GLUCAP 281* 232* 147*    ?   SSI  Other Stroke Risk Factors  Cigarette smokerwilladvise to stop smoking  Substance abuse - UDS: THC POSITIVE, Cocaine NONE DETECTED. Patientwill beadvised to stop using    Obesity,Body  mass index is 48.08 kg/m., BMI >/= 30 associated with increased stroke risk, recommend weight loss, diet and exercise as appropriate   Hx stroke(approximately 2015)  Other Active Problems  Possible Right Lung Pneumonia- No further signs of infections Unasyn has been stopped   Hospital day # 3  Jesus Sanger MD Resident I have personally obtained history,examined this patient, reviewed notes, independently viewed imaging studies, participated in medical decision making and plan of care.ROS completed by me personally and pertinent positives fully documented  I have made any additions or clarifications directly to the above note. Agree with note above.  Patient neurological exam remains quite poor despite aggressive medical therapy.  Continue hypertonic saline with serum sodium goal but perhaps below the goal to 145- 150 given his congestive heart failure status.  I had a long discussion with 2 of his sisters yesterday who are going to be the decision-makers and they agree they would like to continue supportive care over the weekend early next week to make decisions about tracheostomy and PEG tube versus comfort care.  Discussed with Dr. Lynetta Mare critical care medicine. This patient is critically ill and at significant risk of neurological worsening, death and care requires constant monitoring of vital signs, hemodynamics,respiratory  and cardiac monitoring, extensive review of multiple databases, frequent neurological assessment, discussion with family, other specialists and medical decision making of high complexity.I have made any additions or clarifications directly to the above note.This critical care time does not reflect procedure time, or teaching time or supervisory time of PA/NP/Med Resident etc but could involve care discussion time.  I spent 30 minutes of neurocritical care time  in the care of  this patient.      Antony Contras, MD Medical Director [place] Pager: [phone] 10/29/2020 2:01 PM   To contact Stroke Continuity provider, please refer to [email or web address] After hours, contact General Neurology

## 2020-10-29 NOTE — Progress Notes (Signed)
Pharmacy Antibiotic Note  Jesus Osborne is a 40 y.o. male admitted on 10/22/2020 with acute ischemic stroke.  Pharmacy has been consulted for Unasyn dosing for aspiration pneumonia. WBC is elevated. Noted renal dysfunction.  Scr 2.47; CrCl 58 ml/min  Plan: Continue Unasyn 3g IV q8h Trend WBC, temp, renal function  F/U infectious work-up   Height: '5\' 10"'$  (177.8 cm) Weight: (!) 144.7 kg (319 lb 0.1 oz) IBW/kg (Calculated) : 73  Temp (24hrs), Avg:99.3 F (37.4 C), Min:98.7 F (37.1 C), Max:100.2 F (37.9 C)  Recent Labs  Lab 11/07/2020 2254 10/26/20 0006 10/26/20 1633 10/27/20 0322 10/28/20 0442 10/29/20 0305  WBC 15.9*  --   --  12.5* 12.9* 14.3*  CREATININE 2.00* 2.00* 2.02* 2.14* 2.32* 2.47*    Estimated Creatinine Clearance: 58.3 mL/min (A) (by C-G formula based on SCr of 2.47 mg/dL (H)).    No Known Allergies  Alanda Slim, PharmD, [facility] Clinical Pharmacist Please see AMION for all Pharmacists' Contact Phone Numbers 10/29/2020, 8:27 AM

## 2020-10-29 NOTE — Progress Notes (Signed)
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  No Known Allergies  Patient Measurements: Height: '5\' 10"'$  (177.8 cm) Weight: (!) 144.7 kg (319 lb 0.1 oz) IBW/kg (Calculated) : 73 Heparin Dosing Weight: 107.5 kg  Vital Signs: Temp: 100 F (37.8 C) (03/18 2000) Temp Source: Oral (03/18 2000) BP: 131/101 (03/18 2100) Pulse Rate: 105 (03/18 2100)  Labs: Recent Labs    10/27/20 0322 10/27/20 [id number] 10/28/20 0442 10/28/20 0749 10/29/20 0305 10/29/20 0345 10/29/20 1217 10/29/20 2000  HGB 10.4*  --  10.0*  --  10.4*  --   --   --   HCT 29.7*  --  31.4*  --  32.7*  --   --   --   PLT 410*  --  440*  --  402*  --   --   --   HEPARINUNFRC  --    < >  --    < >  --  0.42 0.58 0.48  CREATININE 2.14*  --  2.32*  --  2.47*  --   --   --    < > = values in this interval not displayed.    Estimated Creatinine Clearance: 58.3 mL/min (A) (by C-G formula based on SCr of 2.47 mg/dL (H)).   Assessment: 55 YOM with history of HTN and CVA presented with acute CVA, s/p tPA on overnight 3/14-3/15. MRI shows acute infarcts with petechial hemorrhage. ECHO on 3/15 shows a large hyperechoic mass in left ventricle, and Pharmacy consulted to dose IV heparin using the stroke protocol. Will be conservative with dosing due to bleeding risk.  Heparin level is therapeutic at 0.48 units/mL on 2350 units/hr; no bleeding reported.  Goal of Therapy:  Heparin level 0.3-0.5 units/ml with acute CVA Monitor platelets by anticoagulation protocol: Yes   Plan:  No bolus per stroke protocol - decrease heparin infusion slightly to 2300 units/hr to keep level within range Monitor daily CBC, s/sx bleeding  Ramez Arrona D. Mina Marble, PharmD, BCPS, [place] 10/29/2020, 9:25 PM

## 2020-10-29 NOTE — Progress Notes (Signed)
ANTICOAGULATION CONSULT NOTE - Follow Up Consult  Pharmacy Consult for heparin Indication: mobile mass in left heart ventricle in setting of stroke  Labs: Recent Labs     0000 10/26/20 1633 10/27/20 0322 10/27/20 0952 10/28/20 0442 10/28/20 0749 10/28/20 1745 10/29/20 0305 10/29/20 0345  HGB   < >  --  10.4*  --  10.0*  --   --  10.4*  --   HCT  --   --  29.7*  --  31.4*  --   --  32.7*  --   PLT  --   --  410*  --  440*  --   --  402*  --   HEPARINUNFRC  --   --   --    < >  --  <0.10* 0.19*  --  0.42  CREATININE  --  2.02* 2.14*  --  2.32*  --   --   --   --    < > = values in this interval not displayed.    Assessment/Plan:  40yo male therapeutic on heparin after rate changes. Will continue gtt at current rate of 2400 units/hr and confirm stable with additional level.    Wynona Neat, PharmD, BCPS  10/29/2020,5:11 AM

## 2020-10-29 NOTE — Progress Notes (Signed)
PT Cancellation Note  Patient Details Name: Jesus Osborne MRN: [id number] DOB: 1981/01/16   Cancelled Treatment:    Reason Eval/Treat Not Completed: Patient not medically ready;Active bedrest order today. Pt remains intubated and minimally responsive with 3% hypertonic saline, on strict bedrest. PT will continue to follow and evaluate as appropriate.   Hardie Pulley, DPT   Acute Rehabilitation Department Pager #: [phone]   Otho Bellows 10/29/2020, 12:06 PM

## 2020-10-29 NOTE — Progress Notes (Signed)
OT Cancellation Note  Patient Details Name: Jesus Osborne MRN: [id number] DOB: [birth year]   Cancelled Treatment:    Reason Eval/Treat Not Completed: Patient not medically ready (intubated minimal responsive 3% hypertonic saline strict bedrest) Please discontinue therapy orders if to remain on strict bedrest  Billey Chang, OTR/L  Acute Rehabilitation Services Pager: [phone] Office: [phone] .  10/29/2020, 9:09 AM

## 2020-10-30 DIAGNOSIS — I255 Ischemic cardiomyopathy: Secondary | ICD-10-CM

## 2020-10-30 DIAGNOSIS — Z8673 Personal history of transient ischemic attack (TIA), and cerebral infarction without residual deficits: Secondary | ICD-10-CM

## 2020-10-30 DIAGNOSIS — I6340 Cerebral infarction due to embolism of unspecified cerebral artery: Secondary | ICD-10-CM

## 2020-10-30 DIAGNOSIS — I513 Intracardiac thrombosis, not elsewhere classified: Secondary | ICD-10-CM

## 2020-10-30 LAB — CBC
HCT: 35 % — ABNORMAL LOW (ref 39.0–52.0)
Hemoglobin: 10.6 g/dL — ABNORMAL LOW (ref 13.0–17.0)
MCH: 25.1 pg — ABNORMAL LOW (ref 26.0–34.0)
MCHC: 30.3 g/dL (ref 30.0–36.0)
MCV: 82.9 fL (ref 80.0–100.0)
Platelets: 444 10*3/uL — ABNORMAL HIGH (ref 150–400)
RBC: 4.22 MIL/uL (ref 4.22–5.81)
RDW: 17.6 % — ABNORMAL HIGH (ref 11.5–15.5)
WBC: 13.1 10*3/uL — ABNORMAL HIGH (ref 4.0–10.5)
nRBC: 0.4 % — ABNORMAL HIGH (ref 0.0–0.2)

## 2020-10-30 LAB — TRIGLYCERIDES: Triglycerides: 108 mg/dL (ref <150)

## 2020-10-30 LAB — GLUCOSE, CAPILLARY
Glucose-Capillary: 171 mg/dL — ABNORMAL HIGH (ref 70–99)
Glucose-Capillary: 170 mg/dL — ABNORMAL HIGH (ref 70–99)
Glucose-Capillary: 163 mg/dL — ABNORMAL HIGH (ref 70–99)
Glucose-Capillary: 173 mg/dL — ABNORMAL HIGH (ref 70–99)
Glucose-Capillary: 141 mg/dL — ABNORMAL HIGH (ref 70–99)
Glucose-Capillary: 139 mg/dL — ABNORMAL HIGH (ref 70–99)

## 2020-10-30 LAB — BASIC METABOLIC PANEL
Anion gap: 10 (ref 5–15)
BUN: 63 mg/dL — ABNORMAL HIGH (ref 6–20)
CO2: 25 mmol/L (ref 22–32)
Calcium: 7.8 mg/dL — ABNORMAL LOW (ref 8.9–10.3)
Chloride: 119 mmol/L — ABNORMAL HIGH (ref 98–111)
Creatinine, Ser: 2.71 mg/dL — ABNORMAL HIGH (ref 0.61–1.24)
GFR, Estimated: 30 mL/min — ABNORMAL LOW (ref >60)
Glucose, Bld: 185 mg/dL — ABNORMAL HIGH (ref 70–99)
Potassium: 4.5 mmol/L (ref 3.5–5.1)
Sodium: 154 mmol/L — ABNORMAL HIGH (ref 135–145)

## 2020-10-30 LAB — HEPARIN LEVEL (UNFRACTIONATED): Heparin Unfractionated: 0.47 IU/mL (ref 0.30–0.70)

## 2020-10-30 MED ORDER — FREE WATER
100.0000 mL | Status: DC
  Administered 2020-10-30 – 2020-10-31 (×9): 100 mL

## 2020-10-30 MED ORDER — LACTATED RINGERS IV BOLUS
1000.0000 mL | Freq: Once | INTRAVENOUS | Status: AC
  Administered 2020-10-30: 1000 mL via INTRAVENOUS

## 2020-10-30 NOTE — Progress Notes (Addendum)
STROKE TEAM PROGRESS NOTE   INTERVAL HISTORY No acute events overnight.  He is still intubated but is more alert today. He is able to move his Left side and his right foot to command. He is able to open his eyes which are now midline. Was able to shake his head to simple questions. Will continue to monitor progression.   CCM is following.  Vitals:   10/30/20 0327 10/30/20 0400 10/30/20 0500 10/30/20 0600  BP:  98/64 95/69 105/75  Pulse: (!) 107 (!) 106 (!) 107 (!) 107  Resp: (!) 23 20 (!) 22 (!) 23  Temp:  98.7 F (37.1 C)    TempSrc:  Oral    SpO2: 99% 99% 99% 99%  Weight:  (!) 143 kg    Height:       CBC:  Recent Labs  Lab 11/09/2020 2254 10/26/20 0006 10/29/20 0305 10/30/20 0539  WBC 15.9*   < > 14.3* 13.1*  NEUTROABS 13.4*  --   --   --   HGB 11.8*   < > 10.4* 10.6*  HCT 35.6*   < > 32.7* 35.0*  MCV 77.7*   < > 82.6 82.9  PLT 476*   < > 402* 444*   < > = values in this interval not displayed.   Basic Metabolic Panel:  Recent Labs  Lab 10/28/20 0442 10/28/20 1002 10/28/20 1543 10/28/20 1745 10/29/20 0305 10/29/20 0905 10/29/20 2000 10/30/20 0539  NA 146*   < >  --    < > 151*   < > 153* 154*  K 4.4  --   --   --  4.1  --   --  4.5  CL 114*  --   --   --  117*  --   --  119*  CO2 27  --   --   --  25  --   --  25  GLUCOSE 180*  --   --   --  163*  --   --  185*  BUN 51*  --   --   --  57*  --   --  63*  CREATININE 2.32*  --   --   --  2.47*  --   --  2.71*  CALCIUM 7.5*  --   --   --  7.8*  --   --  7.8*  MG 2.3  --  2.2  --   --   --   --   --   PHOS 4.4  --  4.3  --   --   --   --   --    < > = values in this interval not displayed.   Lipid Panel:  Recent Labs  Lab 10/26/20 0708 10/27/20 0322 10/30/20 0539  CHOL 101  --   --   TRIG 127   < > 108  HDL 24*  --   --   CHOLHDL 4.2  --   --   VLDL 25  --   --   LDLCALC 52  --   --    < > = values in this interval not displayed.   HgbA1c:  Recent Labs  Lab 10/26/20 0045  HGBA1C 8.3*   Urine  Drug Screen:  Recent Labs  Lab 10/26/20 0849  LABOPIA NONE DETECTED  COCAINSCRNUR NONE DETECTED  LABBENZ NONE DETECTED  AMPHETMU NONE DETECTED  THCU POSITIVE*  LABBARB NONE DETECTED    Alcohol Level  Recent Labs  Lab 11/08/2020 2254  ETH <10    IMAGING past 24 hours No results found.  PHYSICAL EXAM Blood pressure 105/75, pulse (!) 107, temperature 98.7 F (37.1 C), temperature source Oral, resp. rate (!) 23, height '5\' 10"'$  (1.778 m), weight (!) 143 kg, SpO2 99 %.  General:more awake and alert still intubated,obese african [place] male, no apparent distress  Lungs: Symmetrical Chest rise, no labored breathing  Cardio: Regular Rate and Rhythm  Abdomen: Soft, non-tender  Neurological Exam:Intubated.Able to open eyes to command. Does not blink to threat  Pupils are small, minimally reactive to light.Fundi could not be visualized. Doll's eye movements are sluggish. Minimal movement of Left leg to noxious stimuli Able to move Right leg on command, Able to move Right hand to command Able to stick out tongue on command Plantars both noticeable   ASSESSMENT/PLAN Mr. Jesus Osborne is a 40 y.o. male with history of HTN, prior stroke(about 7 years ago per patient) with mild R sided weakness, uses a cane to walk who presents with R arm numbness and worsening of his R sided weakness. He was sitting at his porch eating salad when he had sudden onset R sided weakness, mostly with his arm and also his leg. He is R handed. Writes with his R hand. Uses a cane but can walk around fine. Reports had a stroke 7 years ago with residual mild R sided weakness.Also endorses shortness of breath since yesterday which has gotten worse today.  Has made some improvement, able to respond to more commands and shake his head. Prognosis is still poor. Will await Monday for further family discussions.  LeftSuperiorCerebellum,Artery ofPercheron territory, andLeftFrontal Parietal  Cortexinfarcts secondary tocardiogenic emboli left ventricular clot versus mass   code Stroke CT head-Wedge-shaped hypodensity at the posterior left parietal lobe, consistent with an evolving posterior left MCA distribution infarct. No hemorrhage. ASPECTS is 9. Additional remote lacunar infarcts at the left thalamus and left cerebellum. Focal prominence of the extra-axial space at the bilateral occipital lobes, indeterminate, but could reflect encephalomalacia related to prior ischemia or other insult versus focally prominent cortical sulcation.  CT Head WO Contrast3/150220-Similar-appearing acute left parietal infarction.No acute intracranial hemorrhage.  Repeat CT Head WO Contrast3/150420 -Acute left frontal parietal infarct. No hemorrhagic conversion or detected progression.  Repeat CT Head WO Contrast 3/15 1948 -Redemonstrated acute/early subacute infarcts within the left frontal and parietal lobes, superior left cerebellum and bilateral ventromedial midbrain and medial thalami. Mild petechial hemorrhage within the left parietal lobe was better appreciated on the brain MRI performed earlier today. There is posterior fossa mass effect with partial effacement of the fourth ventricle but no evidence of obstructive hydrocephalus at this time. No evidence of interval acute infarct.  Repeat CT Head WO Contrast 3/16 0036 -CT perfusion-Negative CTA for emergent large vessel occlusion. 8 cc area of acute core infarct at the posterior left parietal lobe, corresponding with hypodensity on prior noncontrast head CT. Apparent delayed perfusion seen elsewhere throughout both cerebral and cerebellar hemispheres favored to in large part be artifactual. No definite surrounding ischemic penumbra. Extensive consolidative airspace disease within the partially visualized right greater than left upper lobes, concerning for multifocal pneumonia.  MRI-Acute infarcts in the left superior cerebellum, artery  of percheron territory, and left frontal parietal cortex. Petechial hemorrhage in the left parietal region. Remote hypertensive pattern hemorrhages.  2D Echo- EF: 40%There is a large hyperechoic mass (20 x 7 mm) attached to the inferior wall of the left ventricle, towards the apex. It is  attached with a narrowbase and is moderately mobile. It has increased echogenicity compared tothe myocardium. Most likely this represents a thrombus, but cannot entirely exclude a neoplasm.  EEG - This study is suggestive of moderate diffuse encephalopathy, nonspecific etiology. No seizures or epileptiform discharges were seen throughout the recording.  [id number]  HgbA1c8.3  VTE prophylaxis -SCD's       Diet   Diet NPO time specified         No antithromboticprior to admission, now on Will start IV Heparin at 0000 3/16 due to receiving tPA at 0000 on 3/15.   Therapy recommendations:Pending  Disposition:Pending  Hypertension  Home meds:None  SemiStable  Currently on Cleviprex   Currently has Labetalol PRN  Permissive hypertension (OK if < 220/120) but gradually normalize in 5-7 days  Long-term BP goal normotensive  Hyperlipidemia  Home meds:none  LDL 52, goal < 70  Currently not on any medication  Diabetes type II Uncontrolled  Home meds:None  HgbA1c 8.3, goal < 7.0  CBGs                Recent Labs (last 2 labs)            Recent Labs   10/17/2020 2245 10/26/20 0731 10/26/20 1139  GLUCAP 281* 232* 147*    ?   SSI  Other Stroke Risk Factors  Cigarette smokerwilladvise to stop smoking  Substance abuse - UDS: THC POSITIVE, Cocaine NONE DETECTED. Patientwill beadvised to stop using   Obesity,Body mass index is 48.08 kg/m., BMI >/= 30 associated with increased stroke risk, recommend weight loss, diet and exercise as appropriate   Hx stroke(approximately 2015)  Other Active Problems  Possible Right Lung  Pneumonia- No further signs of infections Unasyn has been stopped     Hospital day # [place] MD Resident   ATTENDING NOTE: I reviewed above note and agree with the assessment and plan. Pt was seen and examined.   40 year old male with history of hypertension, stroke [facility] [place] ago [facility] with right-sided residual, obesity, hypertension, smoker, CHF admitted for right arm numbness and worsening weakness, SOB.  CT showed left MCA infarct, old lacunar infarct at left thalamus and left cerebellum. Status post TPA.  CTA head and neck negative for LVO but multifocal pneumonia.  MRI showed left MCA large infarct, bilateral thalamus left MCA small infarcts.  EEG negative for seizure.  CT head 3/18 showed stable infarct but mild hydrocephalus.  TTE showed EF 40%, LV large mass versus clot.  A1c 8.3, LDL 52 and hypercoagulable work-up negative.  On exam, pt intubated on precedex, eyes cloes but open on voice, able to follow some simple commands but not all. Able to move toes on the left, and able to open close eyes on commands, but not moving UEs on command. With eye opening, eyes in mid position, not blinking to visual threat, doll's eyes present, not tracking, PERRL. Corneal reflex present, gag and cough present. Breathing over the vent, on weaning but with increased WOB.  Facial symmetry not able to test due to ET tube.  Tongue protrusion not cooperative. On pain stimulation, no movement of BUEs, however, follows commands on the left LE 2+/5 proximal and 3+/5 distal toes DF/PF. RLE proximal 1+/5 and distal 2/5. Right positive babinski. Sensation, coordination and gait not tested.  Currently, patient on 3% saline, sodium 154.  WBC 14.3-> 13.1, off Unasyn.  However AKI with CKD, creatinine 2.14-> 2.32-> 2.47-> 2.71, CCM on board.  On tube  feeding '@55'$  for nutrition.  On heparin IV and continue Crestor 5.  Repeat CT in a.m to evaluate hydrocephalus.  Rosalin Hawking, MD PhD Stroke  Neurology 10/30/2020 7:51 PM  This patient is critically ill due to large posterior circulation stroke, cerebral edema with mild hydrocephalus, AKI on CKD, leukocytosis and at significant risk of neurological worsening, death form brain herniation, obstructive hydrocephalus, renal failure, sepsis, seizure. This patient's care requires constant monitoring of vital signs, hemodynamics, respiratory and cardiac monitoring, review of multiple databases, neurological assessment, discussion with family, other specialists and medical decision making of high complexity. I spent 40 minutes of neurocritical care time in the care of this patient.   To contact Stroke Continuity provider, please refer to [email or web address] After hours, contact General Neurology

## 2020-10-30 NOTE — Progress Notes (Signed)
OT Cancellation Note  Patient Details Name: ARTEEN MORAITIS MRN: [id number] DOB: May 13, 1981   Cancelled Treatment:    Reason Eval/Treat Not Completed: Active bedrest order OT order received and appreciated however this conflicts with current bedrest order set. Please increase activity tolerance as appropriate and remove bedrest from orders. . Please contact OT at [phone] if bed rest order is discontinued. OT will hold evaluation at this time and will check back as time allows pending increased activity orders.   Billey Chang, OTR/L  Acute Rehabilitation Services Pager: [phone] Office: [phone] .  10/30/2020, 8:38 AM

## 2020-10-30 NOTE — Progress Notes (Signed)
NAME:  Jesus Osborne, MRN:  [id number], DOB:  Dec 19, 1980, LOS: 4 ADMISSION DATE:  10/15/2020, CONSULTATION DATE:  10/26/20 REFERRING MD:  Lorrin Goodell, CHIEF COMPLAINT:  AMS   Brief History:  Jesus Osborne is a 40 y.o. male who was admitted 3/14 with CVA s/p tPA.  While in [place], had hypoxia requiring NRB then 2L O2 via [place].  Later had change in mental status with minimal response; therefore, intubated before returning to CT.  History of Present Illness:  Pt is encephelopathic; therefore, this HPI is obtained from chart review. Jesus Osborne is a 40 y.o. male who has a PMH including but not limited to prior CVA, HTN, THC and tobacco dependence (see "past medical history" for rest).  He presented to [facility] ED 3/14 with R arm numbness and R sided weakness.  He was taken to CT which was concerning for evolving L MCA infarct.  HE received tPA at 002 (slighly delay due to difficult IV access).  While in CT, he had desaturation requiring NRB and later placed on 2L O2 via [place]. While in ED, later had change in mental status around 0115 with left gaze deviation and decrease in alertness.  Decision made to intubate prior to taking back to CT for repeat scan.  Past Medical History:  has Acute ischemic left MCA stroke ([place]) on their problem list.  Significant Hospital Events:  -3/14 > admit. -3/15 > intubated MRI head 3/15 (06:40)>  1.Acute infarcts in the left left superior cerebellum, artery of percheron territory, and left frontal parietal cortex. 2. Petechial hemorrhage in the left parietal region. 3. Remote hypertensive pattern hemorrhages. -Echo 3/15>>20x34m moderately mobile mass on inferior wall of the LV, increased echogenicity likely representing thrombus but cannot exclude neoplasm, EF 40% -CT head 3/15>>posterior mass effect with partial effacement of 4th ventricle but no obstructive hydrocephalus -EEG 3/15-3/16>> moderate diffuse encephalopathy. No epileptiform discharges -CT head 3/18>>  worsened mass effect almost entirely effacing 4th ventricle with increased ventriculomegaly 15 (01:56)> no acute intracranial hemorrhage - 3/15 started on 3% to mitigate edema - 3/17 family meeting with neurology.  Family aware and realistic regarding possibility for poor neurological recovery.  Plan to observe for recovery over the next 4 days. -3/18 more awake today   Interim History / Subjective:   More awake today has begun to follow commands  Objective   Blood pressure 106/74, pulse (!) 110, temperature (!) 100.7 F (38.2 C), temperature source Axillary, resp. rate (!) 26, height '5\' 10"'$  (1.778 m), weight (!) 143 kg, SpO2 97 %.    Vent Mode: PSV;CPAP FiO2 (%):  [30 %] 30 % Set Rate:  [18 bmp] 18 bmp Vt Set:  [580 mL] 580 mL PEEP:  [5 cmH20] 5 cmH20 Pressure Support:  [10 cmH20] 10 cmH20 Plateau Pressure:  [17 cmH20-28 cmH20] 28 cmH20   Intake/Output Summary (Last 24 hours) at 10/30/2020 0920 Last data filed at 10/30/2020 0537 Gross per 24 hour  Intake 1835.76 ml  Output 1600 ml  Net 235.76 ml   Filed Weights   10/24/2020 2254 10/29/20 0400 10/30/20 0400  Weight: (!) 152 kg (!) 144.7 kg (!) 143 kg    Examination: General: obese male, intubated, off Precedex now. HENT: Orally intubated.  OG tube in place  lungs: mechanically ventilated, faint bilateral inspiratory ronchi Cardiovascular: RRR, normal S1S2, no MRG Abdomen: soft, NABS, peau d'orange appearance of pannus with 1+ pitting edema Extremities: no peripheral edema, 2+ dorsalis pedis pulses, 1+ radial pulses Neuro: [employer]  eyes and tracks to voice.  Disconjugate gaze improving.  Able to follow commands briskly on the left side.  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  Given neurological status and body habitus will likely need tracheostomy. -Start free water slowly correct sodium likely past maximal edema phase. -Continue Coreg for ischemic heart disease -Plan is to observe for degree of neurological recovery and make a decision regarding tracheostomy and PEG by 3/21   Daily Goals Checklist  Pain/Anxiety/Delirium protocol (if indicated): Precedex only Neuro vitals: every 4 hours AED's: None VAP protocol (if indicated): Bundle in place Respiratory support goals: Daily SBT.  Tracheostomy planning based on neurological recovery Blood pressure target: Systolic blood pressure 1 10-1 60 DVT prophylaxis: On systemic anticoagulation for LV thrombus Nutrition Status: Tolerating tube feeds GI prophylaxis: Pantoprazole Fluid status goals: Free water per tube, allow autoregulation Urinary catheter: External catheter Central lines: Right triple-lumen catheter. Glucose control: Well-controlled on SSI Mobility/therapy needs: Bedrest Antibiotic de-escalation: None Home medication reconciliation: None Daily labs: BMP, CBC Code Status: Full code Family Communication: Family updated 3/16 Disposition: ICU   Labs   CBC: Recent Labs  Lab 11/02/2020 2254 10/26/20 0006 10/26/20 0203 10/27/20 0322 10/28/20 0442 10/29/20 0305 10/30/20 0539  WBC 15.9*  --   --  12.5* 12.9* 14.3* 13.1*  NEUTROABS 13.4*  --   --   --   --   --   --   HGB 11.8*   < > 12.9* 10.4* 10.0* 10.4* 10.6*  HCT 35.6*   < > 38.0* 29.7* 31.4* 32.7* 35.0*  MCV 77.7*  --   --  79.2* 81.1 82.6 82.9  PLT 476*  --   --  410* 440* 402* 444*   < > = values in this interval not displayed.    Basic Metabolic Panel: Recent Labs  Lab 10/26/20 1633 10/26/20 2124 10/27/20 0322 10/27/20 0952 10/27/20 1531 10/27/20 2314  10/28/20 0442 10/28/20 1002 10/28/20 1543 10/28/20 1745 10/29/20 0305 10/29/20 0905 10/29/20 1628 10/29/20 2000 10/30/20 0539  NA 136   < > 132* 141   < > 143 146*   < >  --    < > 151* 152* 154* 153* 154*  K 3.1*  --  5.1  --   --   --  4.4  --   --   --  4.1  --   --   --  4.5  CL 98  --  98  --   --   --  114*  --   --   --  117*  --   --   --  119*  CO2 29  --  23  --   --   --  27  --   --   --  25  --   --   --  25  GLUCOSE 124*  --  88  --   --   --  180*  --   --   --  163*  --   --   --  185*  BUN 39*  --  42*  --   --   --  51*  --   --   --  57*  --   --   --  63*  CREATININE 2.02*  --  2.14*  --   --   --  2.32*  --   --   --  2.47*  --   --   --  2.71*  CALCIUM 8.3*  --  7.1*  --   --   --  7.5*  --   --   --  7.8*  --   --   --  7.8*  MG  --   --   --  2.3  --  2.3 2.3  --  2.2  --   --   --   --   --   --   PHOS  --   --   --  4.7*  --  4.2 4.4  --  4.3  --   --   --   --   --   --    < > = values in this interval not displayed.   GFR: Estimated Creatinine Clearance: 52.8 mL/min (A) (by C-G formula based on SCr of 2.71 mg/dL (H)). Recent Labs  Lab 10/26/20 0117 10/27/20 0322 10/28/20 0442 10/29/20 0305 10/30/20 0539  PROCALCITON 0.96  --   --   --   --   WBC  --  12.5* 12.9* 14.3* 13.1*    Liver Function Tests: Recent Labs  Lab 11/06/2020 2254  AST 40  ALT 66*  ALKPHOS 207*  BILITOT 1.2  PROT 7.0  ALBUMIN 1.5*   No results for input(s): LIPASE, AMYLASE in the last 168 hours. No results for input(s): AMMONIA in the last 168 hours.  ABG    Component Value Date/Time   PHART 7.369 10/26/2020 0203   PCO2ART 47.0 10/26/2020 0203   PO2ART 81 (L) 10/26/2020 0203   HCO3 27.1 10/26/2020 0203   TCO2 29 10/26/2020 0203   O2SAT 95.0 10/26/2020 0203     Coagulation Profile: Recent Labs  Lab 10/21/2020 2254  INR 1.4*    Cardiac Enzymes: No results for input(s): CKTOTAL, CKMB, CKMBINDEX, TROPONINI in the last 168 hours.  HbA1C: Hgb A1c MFr Bld   Date/Time Value Ref Range Status  10/26/2020 12:45 AM 8.3 (H) 4.8 - 5.6 % Final    Comment:    (NOTE) Pre diabetes:          5.7%-6.4%  Diabetes:              >6.4%  Glycemic control for   <7.0% adults with diabetes     CBG: Recent Labs  Lab 10/29/20 1515 10/29/20 1916 10/29/20 2306 10/30/20 0311 10/30/20 0745  GLUCAP 173* 152* 175* 171* 170*   CRITICAL CARE Performed by: Kipp Brood   Total critical care time: 40 minutes  Critical care time was exclusive of separately billable procedures and treating other patients.  Critical care was necessary to treat or prevent imminent or life-threatening deterioration.  Critical care was time spent personally by me on the following activities: development of treatment plan with patient and/or surrogate as well as nursing, discussions with consultants, evaluation of patient's response to treatment, examination of patient, obtaining history from patient or surrogate, ordering and performing treatments and interventions, ordering and review of laboratory studies, ordering and review of radiographic studies, pulse oximetry, re-evaluation of patient's condition and participation in multidisciplinary rounds.  Kipp Brood, MD [facility] ICU Physician [place]  Pager: [phone] Mobile: [phone] After  hours: [phone].

## 2020-10-30 NOTE — Progress Notes (Signed)
PT Cancellation Note  Patient Details Name: Jesus Osborne MRN: [id number] DOB: Apr 03, 1981   Cancelled Treatment:    Reason Eval/Treat Not Completed: Active bedrest order this morning. PT will continue to follow and evaluate when appropriate/bedrest orders removed.   Karma Ganja, PT, DPT   Acute Rehabilitation Department Pager #: [phone]   Otho Bellows 10/30/2020, 8:54 AM

## 2020-10-30 NOTE — Progress Notes (Addendum)
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  No Known Allergies  Patient Measurements: Height: '5\' 10"'$  (177.8 cm) Weight: (!) 143 kg (315 lb 4.1 oz) IBW/kg (Calculated) : 73 Heparin Dosing Weight: 107.5 kg  Vital Signs: Temp: 100.7 F (38.2 C) (03/19 0800) Temp Source: Axillary (03/19 0800) BP: 106/74 (03/19 0900) Pulse Rate: 110 (03/19 0900)  Labs: Recent Labs    10/28/20 0442 10/28/20 0749 10/29/20 0305 10/29/20 0345 10/29/20 1217 10/29/20 2000 10/30/20 0539  HGB 10.0*  --  10.4*  --   --   --  10.6*  HCT 31.4*  --  32.7*  --   --   --  35.0*  PLT 440*  --  402*  --   --   --  444*  HEPARINUNFRC  --    < >  --    < > 0.58 0.48 0.47  CREATININE 2.32*  --  2.47*  --   --   --  2.71*   < > = values in this interval not displayed.    Estimated Creatinine Clearance: 52.8 mL/min (A) (by C-G formula based on SCr of 2.71 mg/dL (H)).   Assessment: 71 YOM with history of HTN and CVA presented with acute CVA, s/p tPA on overnight 3/14-3/15. MRI shows acute infarcts with petechial hemorrhage. ECHO on 3/15 shows a large hyperechoic mass in left ventricle, and Pharmacy consulted to dose IV heparin using the stroke protocol. Will be conservative with dosing due to bleeding risk.  Heparin level is therapeutic at 0.47 units/mL on 2300 units/hr; no bleeding reported, CBC is stable.  Goal of Therapy:  Heparin level 0.3-0.5 units/ml with acute CVA Monitor platelets by anticoagulation protocol: Yes   Plan:  No bolus per stroke protocol - continue heparin infusion at 2300 units/hr  Monitor daily heparin level and CBC, s/sx bleeding  Thank you for involving pharmacy in this patient's care.  Renold Genta, PharmD, BCPS Clinical Pharmacist Clinical phone for 10/30/2020 until 3p is [phone] 10/30/2020 11:44 AM  **Pharmacist phone directory can be found on [email or web address] listed under [place]**

## 2020-10-31 ENCOUNTER — Inpatient Hospital Stay (HOSPITAL_COMMUNITY): Payer: Medicaid Other

## 2020-10-31 DIAGNOSIS — G911 Obstructive hydrocephalus: Secondary | ICD-10-CM

## 2020-10-31 DIAGNOSIS — G936 Cerebral edema: Secondary | ICD-10-CM

## 2020-10-31 LAB — BASIC METABOLIC PANEL
Anion gap: 9 (ref 5–15)
BUN: 60 mg/dL — ABNORMAL HIGH (ref 6–20)
CO2: 25 mmol/L (ref 22–32)
Calcium: 7.8 mg/dL — ABNORMAL LOW (ref 8.9–10.3)
Chloride: 121 mmol/L — ABNORMAL HIGH (ref 98–111)
Creatinine, Ser: 2.52 mg/dL — ABNORMAL HIGH (ref 0.61–1.24)
GFR, Estimated: 33 mL/min — ABNORMAL LOW (ref >60)
Glucose, Bld: 114 mg/dL — ABNORMAL HIGH (ref 70–99)
Potassium: 4.5 mmol/L (ref 3.5–5.1)
Sodium: 155 mmol/L — ABNORMAL HIGH (ref 135–145)

## 2020-10-31 LAB — GLUCOSE, CAPILLARY
Glucose-Capillary: 100 mg/dL — ABNORMAL HIGH (ref 70–99)
Glucose-Capillary: 188 mg/dL — ABNORMAL HIGH (ref 70–99)
Glucose-Capillary: 216 mg/dL — ABNORMAL HIGH (ref 70–99)
Glucose-Capillary: 105 mg/dL — ABNORMAL HIGH (ref 70–99)
Glucose-Capillary: 90 mg/dL (ref 70–99)
Glucose-Capillary: 152 mg/dL — ABNORMAL HIGH (ref 70–99)

## 2020-10-31 LAB — POCT I-STAT 7, (LYTES, BLD GAS, ICA,H+H)
Acid-base deficit: 2 mmol/L (ref 0.0–2.0)
Acid-base deficit: 1 mmol/L (ref 0.0–2.0)
Bicarbonate: 22.6 mmol/L (ref 20.0–28.0)
Bicarbonate: 22.8 mmol/L (ref 20.0–28.0)
Calcium, Ion: 1.13 mmol/L — ABNORMAL LOW (ref 1.15–1.40)
Calcium, Ion: 1.13 mmol/L — ABNORMAL LOW (ref 1.15–1.40)
HCT: 28 % — ABNORMAL LOW (ref 39.0–52.0)
HCT: 35 % — ABNORMAL LOW (ref 39.0–52.0)
Hemoglobin: 9.5 g/dL — ABNORMAL LOW (ref 13.0–17.0)
Hemoglobin: 11.9 g/dL — ABNORMAL LOW (ref 13.0–17.0)
O2 Saturation: 96 %
O2 Saturation: 93 %
Patient temperature: 99.3
Patient temperature: 97.9
Potassium: 4.5 mmol/L (ref 3.5–5.1)
Potassium: 4.8 mmol/L (ref 3.5–5.1)
Sodium: 152 mmol/L — ABNORMAL HIGH (ref 135–145)
Sodium: 154 mmol/L — ABNORMAL HIGH (ref 135–145)
TCO2: 24 mmol/L (ref 22–32)
TCO2: 24 mmol/L (ref 22–32)
pCO2 arterial: 35.8 mmHg (ref 32.0–48.0)
pCO2 arterial: 32.2 mmHg (ref 32.0–48.0)
pH, Arterial: 7.411 (ref 7.350–7.450)
pH, Arterial: 7.456 — ABNORMAL HIGH (ref 7.350–7.450)
pO2, Arterial: 83 mmHg (ref 83.0–108.0)
pO2, Arterial: 60 mmHg — ABNORMAL LOW (ref 83.0–108.0)

## 2020-10-31 LAB — SODIUM
Sodium: 149 mmol/L — ABNORMAL HIGH (ref 135–145)
Sodium: 154 mmol/L — ABNORMAL HIGH (ref 135–145)
Sodium: 157 mmol/L — ABNORMAL HIGH (ref 135–145)

## 2020-10-31 LAB — CBC
HCT: 34.7 % — ABNORMAL LOW (ref 39.0–52.0)
Hemoglobin: 10.8 g/dL — ABNORMAL LOW (ref 13.0–17.0)
MCH: 25.9 pg — ABNORMAL LOW (ref 26.0–34.0)
MCHC: 31.1 g/dL (ref 30.0–36.0)
MCV: 83.2 fL (ref 80.0–100.0)
Platelets: 441 10*3/uL — ABNORMAL HIGH (ref 150–400)
RBC: 4.17 MIL/uL — ABNORMAL LOW (ref 4.22–5.81)
RDW: 17.6 % — ABNORMAL HIGH (ref 11.5–15.5)
WBC: 10.1 10*3/uL (ref 4.0–10.5)
nRBC: 0.7 % — ABNORMAL HIGH (ref 0.0–0.2)

## 2020-10-31 LAB — HEPARIN LEVEL (UNFRACTIONATED)
Heparin Unfractionated: 0.44 IU/mL (ref 0.30–0.70)
Heparin Unfractionated: 0.33 IU/mL (ref 0.30–0.70)

## 2020-10-31 MED ORDER — MANNITOL 25 % IV SOLN: 150.0000 g | Freq: Once | Status: DC

## 2020-10-31 MED ORDER — MANNITOL 20 % IV SOLN
150.0000 g | Freq: Once | Status: AC
  Administered 2020-10-31: 150 g via INTRAVENOUS
  Filled 2020-10-31: qty 750

## 2020-10-31 MED ORDER — MIDAZOLAM HCL 2 MG/2ML IJ SOLN
INTRAMUSCULAR | Status: AC
  Filled 2020-10-31: qty 2

## 2020-10-31 MED ORDER — HYDRALAZINE HCL 20 MG/ML IJ SOLN
10.0000 mg | INTRAMUSCULAR | Status: DC | PRN
  Administered 2020-10-31: 10 mg via INTRAVENOUS
  Filled 2020-10-31: qty 1

## 2020-10-31 MED ORDER — NOREPINEPHRINE 4 MG/250ML-% IV SOLN
0.0000 ug/min | INTRAVENOUS | Status: DC
  Administered 2020-10-31: 2 ug/min via INTRAVENOUS
  Administered 2020-10-31: 20 ug/min via INTRAVENOUS
  Filled 2020-10-31 (×4): qty 250

## 2020-10-31 MED ORDER — SODIUM CHLORIDE 3 % IV SOLN
INTRAVENOUS | Status: DC
  Filled 2020-10-31 (×2): qty 500

## 2020-10-31 MED ORDER — SODIUM CHLORIDE 3 % IV SOLN
INTRAVENOUS | Status: DC
  Filled 2020-10-31: qty 1000

## 2020-10-31 MED ORDER — MIDAZOLAM HCL (PF) 5 MG/ML IJ SOLN: 5.0000 mg | Freq: Once | INTRAMUSCULAR | Status: DC

## 2020-10-31 MED ORDER — MIDAZOLAM HCL 2 MG/2ML IJ SOLN
INTRAMUSCULAR | Status: AC
  Administered 2020-10-31: 5 mg
  Filled 2020-10-31: qty 6

## 2020-10-31 MED ORDER — SODIUM CHLORIDE 3 % IV BOLUS
250.0000 mL | Freq: Once | INTRAVENOUS | Status: AC
  Administered 2020-10-31: 250 mL via INTRAVENOUS
  Filled 2020-10-31: qty 500

## 2020-10-31 MED ORDER — MIDAZOLAM HCL 2 MG/2ML IJ SOLN: 5.0000 mg | Freq: Once | INTRAMUSCULAR | Status: AC

## 2020-10-31 MED ORDER — MIDAZOLAM HCL 2 MG/2ML IJ SOLN
5.0000 mg | Freq: Once | INTRAMUSCULAR | Status: AC
  Administered 2020-10-31: 5 mg via INTRAVENOUS

## 2020-10-31 MED ORDER — MIDAZOLAM HCL 2 MG/2ML IJ SOLN
INTRAMUSCULAR | Status: AC
  Filled 2020-10-31: qty 4

## 2020-10-31 MED ORDER — CEFAZOLIN SODIUM-DEXTROSE 1-4 GM/50ML-% IV SOLN
1.0000 g | Freq: Three times a day (TID) | INTRAVENOUS | Status: DC
  Administered 2020-10-31 – 2020-11-03 (×9): 1 g via INTRAVENOUS
  Filled 2020-10-31 (×9): qty 50

## 2020-10-31 MED ORDER — SODIUM CHLORIDE 23.4 % INJECTION (4 MEQ/ML) FOR IV ADMINISTRATION
120.0000 meq | Freq: Once | INTRAVENOUS | Status: DC
  Filled 2020-10-31: qty 30

## 2020-10-31 MED ORDER — HYDRALAZINE HCL 20 MG/ML IJ SOLN
10.0000 mg | INTRAMUSCULAR | Status: DC | PRN
  Administered 2020-11-01 (×2): 10 mg via INTRAVENOUS
  Filled 2020-10-31 (×2): qty 1

## 2020-10-31 MED ORDER — MIDAZOLAM HCL (PF) 5 MG/ML IJ SOLN: 4.0000 mg | Freq: Once | INTRAMUSCULAR | Status: DC

## 2020-10-31 NOTE — Progress Notes (Signed)
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  No Known Allergies  Patient Measurements: Height: '5\' 10"'$  (177.8 cm) Weight: (!) 148.7 kg (327 lb 13.2 oz) IBW/kg (Calculated) : 73 Heparin Dosing Weight: 107.5 kg  Vital Signs: Temp: 98.8 F (37.1 C) (03/20 1600) Temp Source: Axillary (03/20 1600) BP: 138/103 (03/20 1929) Pulse Rate: 95 (03/20 1929)  Labs: Recent Labs    10/29/20 0305 10/29/20 0345 10/30/20 0539 10/31/20 0456 10/31/20 0556 10/31/20 0757 10/31/20 1811  HGB 10.4*  --  10.6* 10.8* 9.5* 11.9*  --   HCT 32.7*  --  35.0* 34.7* 28.0* 35.0*  --   PLT 402*  --  444* 441*  --   --   --   HEPARINUNFRC  --    < > 0.47 0.44  --   --  0.33  CREATININE 2.47*  --  2.71* 2.52*  --   --   --    < > = values in this interval not displayed.    Estimated Creatinine Clearance: 58.1 mL/min (A) (by C-G formula based on SCr of 2.52 mg/dL (H)).   Assessment: 89 YOM with history of HTN and CVA presented with acute CVA, s/p tPA on overnight 3/14-3/15. MRI shows acute infarcts with petechial hemorrhage. ECHO on 3/15 shows a large hyperechoic mass in left ventricle, and Pharmacy consulted to dose IV heparin using the stroke protocol. Will be conservative with dosing due to bleeding risk.  Heparin level therapeutic at 0.33 units/mL on 2300 units/hr. Level is lower than yesterday likely due to holding for 7 hours this morning for EVD placement. Patient is s/p EVD placement and NSGY was ok resuming IV heparin with no bolus.   Goal of Therapy:  Heparin level 0.3-0.5 units/ml with acute CVA Monitor platelets by anticoagulation protocol: Yes   Plan:  Continue heparin infusion at 2300 units/hr  Monitor daily heparin level and CBC, s/sx bleeding  Thank you for involving pharmacy in this patient's care.  Benetta Spar, PharmD, BCPS, BCCP Clinical Pharmacist  Please check AMION for all [place] phone numbers After 10:00 PM, call  [place] [phone]

## 2020-10-31 NOTE — Progress Notes (Signed)
eLink Physician-Brief Progress Note Patient Name: Jesus Osborne DOB: 1980/10/27 MRN: [id number]   Date of Service  10/31/2020  HPI/Events of Note  Multiple issues: 1. Hypotension - BP = 65/50 with MAP = 57 and 2. Hydrocephalus - Neurology requests that his pCO2 be maintained in the 26-30 range.  eICU Interventions  Plan: 1. Norepinephrine IV infusion. Titrate to MAP >= 65. 2. Monitor CVP now and Q 4 hours.  3. ABG STAT.     Intervention Category Major Interventions: Hypotension - evaluation and management  Evette Diclemente Eugene 10/31/2020, 5:15 AM

## 2020-10-31 NOTE — Progress Notes (Signed)
Current BP 165/126 w/o PRNs available. BP parameters clarified with Neurologist Lorrin Goodell MD) and new goals are SBP<180 and DBP< 110. Elink MD Oletta Darter) notified for PRN order. RN to continue to monitor.

## 2020-10-31 NOTE — Op Note (Signed)
 *   No surgery found *  PATIENT:   Jesus Osborne  40 y.o. male With a cerebellar infarct and obstructive hydrocephalus. I was asked to place a ventricular catheter.  PRE-OPERATIVE DIAGNOSIS: obstructive hydrocephalus  POST-OPERATIVE DIAGNOSIS:  Same  PROCEDURE:  righty frontal ventricular catheter placement  SURGEON:  Malaiya Paczkowski ANESTHESIA:   local DRAINS: Ventriculostomy Drain in the right lateral ventricle.   SPECIMEN: non  DICTATION: [name] had male  head shaved and then prepared in a sterile manner. I draped male  in a sterile manner. I injected lidocaine into the planned coronal incision in line with the right pupillary line anterior to the tragus. I opened the skin with a 15 blade. I used the hand twist drill to create a burr hole. I opened the dura with the 20 gauge spinal needle. I passed the ventricular catheter into the lateral ventricle. There was brisk flow of spinal fluid. I tunneled the catheter posteriorly and secured it to the skin at the exit. I approximated the scalp edges with nylon sutures. I placed a sterile dressing, then connected the catheter to the drainage system.   PLAN OF CARE: Admit to inpatient   PATIENT DISPOSITION:  PACU - hemodynamically stable.

## 2020-10-31 NOTE — Progress Notes (Addendum)
PT Cancellation Note  Patient Details Name: Jesus Osborne MRN: [id number] DOB: 22-Jul-1981   Cancelled Treatment:    Reason Eval/Treat Not Completed: Medical issues which prohibited therapy (Sign off.  Please reorder when pts' medical status improves.  Pt down for CT currently and still on strict bedrest.)   Alvira Philips 10/31/2020, 3:20 PM Krishang Reading M,PT Acute Rehab Services [phone] (pager)

## 2020-10-31 NOTE — Progress Notes (Addendum)
eLink Physician-Brief Progress Note Patient Name: Jesus Osborne DOB: [birth year] MRN: [id number]   Date of Service  10/31/2020  HPI/Events of Note  Hypertension - BP = 177/136. Impending herniation???   eICU Interventions  Plan: 1. Hydralazine 10 mg IV Q 4 hours PRN SBP > 180 or DBP > 110. 2. Bedside nurse to check with neurology to see where they would like BP parameters at this point in the patient's clinical course.      Intervention Category Major Interventions: Hypertension - evaluation and management  [name] 10/31/2020, 9:30 PM

## 2020-10-31 NOTE — Progress Notes (Signed)
NAME:  Jesus Osborne, MRN:  [id number], DOB:  [birth year], LOS: 5 ADMISSION DATE:  10/12/2020, CONSULTATION DATE:  10/26/20 REFERRING MD:  Lorrin Goodell, CHIEF COMPLAINT:  AMS   Brief History:  Jesus Osborne is a 40 y.o. male who was admitted 3/14 with CVA s/p tPA.  While in [place], had hypoxia requiring NRB then 2L O2 via [place].  Later had change in mental status with minimal response; therefore, intubated before returning to CT.  History of Present Illness:  Pt is encephelopathic; therefore, this HPI is obtained from chart review. Jesus Osborne is a 40 y.o. male who has a PMH including but not limited to prior CVA, HTN, THC and tobacco dependence (see "past medical history" for rest).  He presented to [facility] ED 3/14 with R arm numbness and R sided weakness.  He was taken to CT which was concerning for evolving L MCA infarct.  HE received tPA at 002 (slighly delay due to difficult IV access).  While in CT, he had desaturation requiring NRB and later placed on 2L O2 via [place]. While in ED, later had change in mental status around 0115 with left gaze deviation and decrease in alertness.  Decision made to intubate prior to taking back to CT for repeat scan.  Past Medical History:  has Acute ischemic left MCA stroke ([place]) on their problem list.  Significant Hospital Events:  -3/14 > admit. -3/15 > intubated MRI head 3/15 (06:40)>  1.Acute infarcts in the left left superior cerebellum, artery of [place], and left frontal parietal cortex. 2. Petechial hemorrhage in the left parietal region. 3. Remote hypertensive pattern hemorrhages. -Echo 3/15>>20x73m moderately mobile mass on inferior wall of the LV, increased echogenicity likely representing thrombus but cannot exclude neoplasm, EF 40% -CT head 3/15>>posterior mass effect with partial effacement of 4th ventricle but no obstructive hydrocephalus -EEG 3/15-3/16>> moderate diffuse encephalopathy. No epileptiform discharges -CT head 3/18>>  worsened mass effect almost entirely effacing 4th ventricle with increased ventriculomegaly 15 (01:56)> no acute intracranial hemorrhage - 3/15 started on 3% to mitigate edema - 3/17 family meeting with neurology.  Family aware and realistic regarding possibility for poor neurological recovery.  Plan to observe for recovery over the next 4 days. -3/18 more awake today - 3/19 exam decline overnight with worsening hydrocephalus on CT  -3/20 EVD placed after heparin washout.    Interim History / Subjective:   Abrupt worsening of exam overnight with increasing hydrocephalus on CT.  Objective   Blood pressure 108/81, pulse 100, temperature 98.8 F (37.1 C), temperature source Axillary, resp. rate (!) 26, height '5\' 10"'$  (1.778 m), weight (!) 148.7 kg, SpO2 99 %. CVP:  [18 mmHg-19 mmHg] 19 mmHg  Vent Mode: PRVC FiO2 (%):  [30 %-40 %] 40 % Set Rate:  [18 bmp-30 bmp] 30 bmp Vt Set:  [580 mL] 580 mL PEEP:  [5 cmH20] 5 cmH20 Plateau Pressure:  [20 cmH20-23 cmH20] 20 cmH20   Intake/Output Summary (Last 24 hours) at 10/31/2020 1412 Last data filed at 10/31/2020 1400 Gross per 24 hour  Intake 3081.48 ml  Output 3447 ml  Net -365.52 ml   Filed Weights   10/29/20 0400 10/30/20 0400 10/31/20 0400  Weight: (!) 144.7 kg (!) 143 kg (!) 148.7 kg    Examination: General: obese male, intubated, off Precedex now. HENT: Orally intubated.  OG tube in place  lungs: mechanically ventilated, faint bilateral inspiratory ronchi Cardiovascular: RRR, normal S1S2, no MRG Abdomen: soft, NABS, peau d'orange appearance of  pannus with 1+ pitting edema Extremities: no peripheral edema, 2+ dorsalis pedis pulses, 1+ radial pulses Neuro: No longer following commands or opening eyes  Disconjugate gaze.  Bilateral extensor posturing.   Resolved Hospital Problem list   Hyponatremia  Assessment & Plan:   Critically ill due to acute hydrocephalus requiring EVD placement.  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  -Continue secondary stroke prevention -Daily SBT.  Given neurological status and body habitus will likely need tracheostomy. -Continue Coreg for ischemic heart disease -Given this new development, odds of favorable recovery is further diminished.     Daily Goals Checklist  Pain/Anxiety/Delirium protocol (if indicated): Precedex only Neuro vitals: every 2 hours AED's: None VAP protocol (if indicated): Bundle in place Respiratory support goals: Daily SBT.  Tracheostomy planning based on neurological recovery Blood pressure target: Systolic blood pressure 1 10-1 60 DVT prophylaxis: On systemic anticoagulation for LV thrombus Nutrition Status: Tolerating tube feeds GI prophylaxis: Pantoprazole Fluid status goals: Free water per tube, allow autoregulation Urinary catheter: External catheter Central lines: Right triple-lumen catheter. Glucose control: Well-controlled on SSI Mobility/therapy needs: Bedrest Antibiotic de-escalation: None Home medication reconciliation: None Daily labs: BMP, CBC Code Status: Full code Family Communication: Family updated 3/16 Disposition: ICU   Labs   CBC: Recent Labs  Lab 10/27/2020 2254 10/26/20 0006 10/27/20 0322 10/28/20 0442 10/29/20 0305 10/30/20 0539 10/31/20 0456 10/31/20 0556 10/31/20 0757  WBC 15.9*  --  12.5* 12.9* 14.3* 13.1* 10.1  --   --   NEUTROABS 13.4*  --   --   --   --   --   --   --   --   HGB 11.8*   < > 10.4* 10.0* 10.4* 10.6* 10.8* 9.5* 11.9*  HCT 35.6*   < > 29.7* 31.4* 32.7* 35.0* 34.7* 28.0* 35.0*  MCV 77.7*  --  79.2* 81.1 82.6 82.9 83.2  --   --   PLT 476*  --  410* 440* 402* 444* 441*  --   --    < > = values  in this interval not displayed.    Basic Metabolic Panel: Recent Labs  Lab 10/27/20 0322 10/27/20 0952 10/27/20 1531 10/27/20 2314 10/28/20 0442 10/28/20 1002 10/28/20 1543 10/28/20 1745 10/29/20 0305 10/29/20 0905 10/30/20 0539 10/31/20 0456 10/31/20 0556 10/31/20 0757 10/31/20 1123 10/31/20 1311  NA 132* 141   < > 143 146*   < >  --    < > 151*   < > 154* 155* 152* 154* 149* 154*  K 5.1  --   --   --  4.4  --   --   --  4.1  --  4.5 4.5 4.5 4.8  --   --   CL 98  --   --   --  114*  --   --   --  117*  --  119* 121*  --   --   --   --   CO2 23  --   --   --  27  --   --   --  25  --  25 25  --   --   --   --   GLUCOSE 88  --   --   --  180*  --   --   --  163*  --  185* 114*  --   --   --   --   BUN 42*  --   --   --  51*  --   --   --  57*  --  63* 60*  --   --   --   --   CREATININE 2.14*  --   --   --  2.32*  --   --   --  2.47*  --  2.71* 2.52*  --   --   --   --   CALCIUM 7.1*  --   --   --  7.5*  --   --   --  7.8*  --  7.8* 7.8*  --   --   --   --   MG  --  2.3  --  2.3 2.3  --  2.2  --   --   --   --   --   --   --   --   --   PHOS  --  4.7*  --  4.2 4.4  --  4.3  --   --   --   --   --   --   --   --   --    < > = values in this interval not displayed.   GFR: Estimated Creatinine Clearance: 58.1 mL/min (A) (by C-G formula based on SCr of 2.52 mg/dL (H)). Recent Labs  Lab 10/26/20 0117 10/27/20 0322 10/28/20 0442 10/29/20 0305 10/30/20 0539 10/31/20 0456  PROCALCITON 0.96  --   --   --   --   --   WBC  --    < > 12.9* 14.3* 13.1* 10.1   < > = values in this interval not displayed.    Liver Function Tests: Recent Labs  Lab 10/14/2020 2254  AST 40  ALT 66*  ALKPHOS 207*  BILITOT 1.2  PROT 7.0  ALBUMIN 1.5*   No results for input(s): LIPASE, AMYLASE in the last 168 hours. No results for input(s): AMMONIA in the last 168 hours.  ABG    Component Value Date/Time   PHART 7.456 (H) 10/31/2020 0757   PCO2ART 32.2 10/31/2020 0757   PO2ART 60 (L)  10/31/2020 0757   HCO3 22.8 10/31/2020 0757   TCO2 24 10/31/2020 0757   ACIDBASEDEF 1.0 10/31/2020 0757   O2SAT 93.0 10/31/2020 0757     Coagulation Profile: Recent Labs  Lab 10/21/2020 2254  INR 1.4*    Cardiac Enzymes: No results for input(s): CKTOTAL, CKMB, CKMBINDEX, TROPONINI in the last 168 hours.  HbA1C: Hgb A1c MFr Bld  Date/Time Value Ref Range Status  10/26/2020 12:45 AM 8.3 (H) 4.8 - 5.6 % Final    Comment:    (NOTE) Pre diabetes:          5.7%-6.4%  Diabetes:              >6.4%  Glycemic control for   <7.0% adults with diabetes     CBG: Recent Labs  Lab 10/30/20 1910 10/30/20 2250 10/31/20 0258 10/31/20 0724 10/31/20 1111  GLUCAP 141* 139* 100* 188* 216*   CRITICAL CARE Performed by: Kipp Brood   Total critical care time: 40 minutes  Critical care time was exclusive of separately billable procedures and treating other patients.  Critical care was necessary to treat or prevent imminent or life-threatening deterioration.  Critical care  was time spent personally by me on the following activities: development of treatment plan with patient and/or surrogate as well as nursing, discussions with consultants, evaluation of patient's response to treatment, examination of patient, obtaining history from patient or surrogate, ordering and performing treatments and interventions, ordering and review of laboratory studies, ordering and review of radiographic studies, pulse oximetry, re-evaluation of patient's condition and participation in multidisciplinary rounds.  Kipp Brood, MD [facility] ICU Physician [place]  Pager: [phone] Mobile: [phone] After hours: [phone].

## 2020-10-31 NOTE — Progress Notes (Signed)
RT NOTE: patient transported to CT and back to room 99991111 without complications.  Will continue to monitor.

## 2020-10-31 NOTE — Progress Notes (Signed)
Heparin on hold for EVD placement later today. Pt went hypotensive and Elink MD notified. Awaiting orders. RN to continue to monitor.

## 2020-10-31 NOTE — Progress Notes (Addendum)
Brief Neuro Update:  Notified by radiology about new obstructive hydrocephalus on CTH 2/2 cytotoxic edema from the left cerebellar infarct.  Recs: - Mannitol 1g/Kg Iv once - Hypertonic saline 250cc bolus followed by 36m/hr with goal Na of 155-160. - STAT Neurosurgery consult - appreciate the help from Dr. CParks Neptunewith the Neurosurgery team. Heparin gtt on hold. Dr. CCameron Sprangrequested to be on hold for 3 hours prior to EVD placement. - Head of bed above 30 degrees - Hyperventilation to a PCO2 of 324mg. - Ms. Jesus Harmanho is listed as patient's contact notified.

## 2020-10-31 NOTE — Progress Notes (Addendum)
STROKE TEAM PROGRESS NOTE   INTERVAL HISTORY  CT head this morning revealed new obstructive hydrocephalus with cytotoxic edema from the left cerebellar infarct.  Mannitol 1g/Kg IV given once.  Hypertonic saline 250cc bolus followed by 85m/hr with goal Na of 155-160.  Neurosurgery consulted.  Heparin infusion was held for 3 hours and an EVD was placed.  Patient was sedated from the procedure.  Neuro exam at that time:  Does not open eyes, left pupil 433mreactive, right 29m98monreactive, + corneals, + cough, Flicker in RUE, bilateral lower extremities.  LUE no movement.   CCM is following.  Vitals:   10/31/20 1605 10/31/20 1615 10/31/20 1638 10/31/20 1645  BP:  97/70  (!) 129/94  Pulse: 94 93  92  Resp:  (!) 7  (!) 30  Temp:      TempSrc:      SpO2:  98% 98% 98%  Weight:      Height:       CBC:  Recent Labs  Lab 11/08/2020 2254 10/26/20 0006 10/30/20 0539 10/31/20 0456 10/31/20 0556 10/31/20 0757  WBC 15.9*   < > 13.1* 10.1  --   --   NEUTROABS 13.4*  --   --   --   --   --   HGB 11.8*   < > 10.6* 10.8* 9.5* 11.9*  HCT 35.6*   < > 35.0* 34.7* 28.0* 35.0*  MCV 77.7*   < > 82.9 83.2  --   --   PLT 476*   < > 444* 441*  --   --    < > = values in this interval not displayed.   Basic Metabolic Panel:  Recent Labs  Lab 10/28/20 0442 10/28/20 1002 10/28/20 1543 10/28/20 1745 10/30/20 0539 10/31/20 0456 10/31/20 0556 10/31/20 0757 10/31/20 1123 10/31/20 1311  NA 146*   < >  --    < > 154* 155* 152* 154* 149* 154*  K 4.4  --   --    < > 4.5 4.5 4.5 4.8  --   --   CL 114*  --   --    < > 119* 121*  --   --   --   --   CO2 27  --   --    < > 25 25  --   --   --   --   GLUCOSE 180*  --   --    < > 185* 114*  --   --   --   --   BUN 51*  --   --    < > 63* 60*  --   --   --   --   CREATININE 2.32*  --   --    < > 2.71* 2.52*  --   --   --   --   CALCIUM 7.5*  --   --    < > 7.8* 7.8*  --   --   --   --   MG 2.3  --  2.2  --   --   --   --   --   --   --   PHOS 4.4  --  4.3   --   --   --   --   --   --   --    < > = values in this interval not displayed.   Lipid Panel:  Recent Labs  Lab 10/26/20 0708 10/27/20 0322 10/30/20  0539  CHOL 101  --   --   TRIG 127   < > 108  HDL 24*  --   --   CHOLHDL 4.2  --   --   VLDL 25  --   --   LDLCALC 52  --   --    < > = values in this interval not displayed.   HgbA1c:  Recent Labs  Lab 10/26/20 0045  HGBA1C 8.3*   Urine Drug Screen:  Recent Labs  Lab 10/26/20 0849  LABOPIA NONE DETECTED  COCAINSCRNUR NONE DETECTED  LABBENZ NONE DETECTED  AMPHETMU NONE DETECTED  THCU POSITIVE*  LABBARB NONE DETECTED    Alcohol Level  Recent Labs  Lab 11/11/2020 2254  ETH <10    IMAGING past 24 hours CT HEAD WO CONTRAST  Result Date: 10/31/2020 CLINICAL DATA:  Hydrocephalus.  Ventriculostomy placement. EXAM: CT HEAD WITHOUT CONTRAST TECHNIQUE: Contiguous axial images were obtained from the base of the skull through the vertex without intravenous contrast. COMPARISON:  Earlier same day FINDINGS: Brain: Ventriculostomy placed from a right frontal approach, entering the frontal horn of the right lateral ventricle, passing through the foramen of Monro with the tip in the inferior third ventricle. There is reduction in size of the lateral and third ventricles. Very small amount of subdural blood and air at the site ventriculostomy placement. No significant intraparenchymal hemorrhage along the ventriculostomy. Extensive infarction within the cerebellum as seen previously with low-density, swelling and obstruction of the fourth ventricle. Petechial blood products without measurable hematoma. Acute infarction affecting the thalami left more than right as seen previously. No hemorrhagic transformation. Acute infarction in the left parietal cortical and subcortical brain as seen previously. Minimal petechial blood products but no frank hematoma. No new brain insult is identified otherwise. Vascular: Negative Skull: Otherwise negative  Sinuses/Orbits: Clear/normal Other: None IMPRESSION: 1. Ventriculostomy placed from a right frontal approach, entering the frontal horn of the right lateral ventricle, passing through the foramen of Monro with the tip in the inferior third ventricle. There is reduction in size of the lateral and third ventricles. Very small amount of subdural blood and air at the site of ventriculostomy placement. 2. Extensive infarction within the cerebellum as seen previously. Petechial blood products without measurable hematoma. 3. Acute infarction in the left parietal cortical and subcortical brain as seen previously. Minimal petechial blood products but no frank hematoma. 4. Bilateral thalamic infarctions as seen previously, left more than right. Electronically Signed   By: Nelson Chimes M.D.   On: 10/31/2020 15:53   CT HEAD WO CONTRAST  Addendum Date: 10/31/2020   ADDENDUM REPORT: 10/31/2020 04:34 ADDENDUM: Study discussed by telephone with Dr. Lorrin Goodell on 10/31/2020 at 0422 hours. Electronically Signed   By: Genevie Ann M.D.   On: 10/31/2020 04:34   Result Date: 10/31/2020 CLINICAL DATA:  40 year old male with relatively large left SCA infarct. EXAM: CT HEAD WITHOUT CONTRAST TECHNIQUE: Contiguous axial images were obtained from the base of the skull through the vertex without intravenous contrast. COMPARISON:  Brain MRI 10/26/2020.  Head CT 10/29/2020. FINDINGS: Brain: Moderate new lateral and 3rd ventriculomegaly with transependymal edema (series 2, image 21). Dilated temporal horns. Effaced 4th ventricle, with relatively large area of cytotoxic edema in the left cerebellum extending to or just across midline. Evidence of petechial hemorrhage in the vermis on series 2, image 14. No malignant hemorrhagic transformation. Newly effaced basilar cisterns in the posterior fossa. Narrow due suprasellar cistern does remain patent at this time. No  other acute intracranial hemorrhage identified. Stable cytotoxic edema left superior  parietal lobe. Progressed edema in the bilateral thalami left greater than right (series 2, image 19). Vascular: Mild Calcified atherosclerosis at the skull base. No suspicious intracranial vascular hyperdensity. Skull: No acute osseous abnormality identified. Sinuses/Orbits: Intubated. Visualized paranasal sinuses and mastoids are stable and well pneumatized. Other: No acute orbit or scalp soft tissue findings. IMPRESSION: 1. Acute lateral and 3rd ventriculomegaly with transependymal edema secondary to loss of basilar cisterns and obstructed 4th ventricle. Recommend Neurosurgery consultation. 2. Left cerebellar infarct with abundant cytotoxic edema demonstrates new petechial hemorrhage. But there is no malignant hemorrhagic transformation. 3. Progressed edema in the bilateral thalami, greater on the left. 4. Stable edema left parietal lobe. I paged Dr. Bartholome Bill at [phone] hours via the [facility] messaging system regarding these results. Electronically Signed: By: Genevie Ann M.D. On: 10/31/2020 04:21    PHYSICAL EXAM Blood pressure (!) 129/94, pulse 92, temperature 98.8 F (37.1 C), temperature source Axillary, resp. rate (!) 30, height '5\' 10"'$  (1.778 m), weight (!) 148.7 kg, SpO2 98 %.  General:sedated,obese african [place] male, no apparent distress Lungs: Symmetrical Chest rise, no labored breathing Cardio: Regular Rate and Rhythm Abdomen: Soft, non-tender  Neurological Exam:Intubated.unable to open eyes to command. Does not blink to threat  Pupil 61m reactive, right 265mnonreactive.Fundi could not be visualized. Doll's eye movements are sluggish. Flicker to noxious stimuli on RUE, LLE, RLE, no movement on LUE Plantars both noticeable   ASSESSMENT/PLAN Mr. KeWAYLEN LEVIs a [age].o. male with history of HTN, prior stroke(about 7 years ago per patient) with mild R sided weakness, uses a cane to walk who presents with R arm numbness and worsening of his R sided weakness. He was sitting at his  porch eating salad when he had sudden onset R sided weakness, mostly with his arm and also his leg. He is R handed. Writes with his R hand. Uses a cane but can walk around fine. Reports had a stroke 7 years ago with residual mild R sided weakness.Also endorses shortness of breath since yesterday which has gotten worse today.  Has made some improvement, able to respond to more commands and shake his head. Prognosis is still poor. Will await Monday for further family discussions.  Stroke - LeftSuperiorCerebellum,Artery ofPercheron, andLeftFrontal Parietal Cortexinfarcts s/p tPA, secondary tocardiogenic emboli left ventricular clot versus mass   CT showed left MCA infarct, old lacunar infarct at left thalamus and left cerebellum.   CTA head and neck negative for LVO but multifocal pneumonia.    MRI showed left MCA large infarct, bilateral thalamus left MCA small infarcts.   CT head 3/18 showed stable infarct but mild hydrocephalus.    CT head 3/20 Acute lateral and 3rd ventriculomegaly with transependymal edema secondary to loss of basilar cisterns and obstructed 4th ventricle.  2D Echo- EF 40%, LV large mass versus clot.    EEG negative for seizure.  [id number]  VTE prophylaxis - heparin IV  No antithromboticprior to admission, now on IV Heparin   Therapy recommendations:Pending  Disposition:Pending  Obstructive Hydrocephalus  CT head 3/20 Acute lateral and 3rd ventriculomegaly with transependymal edema secondary to loss of basilar cisterns and obstructed 4th ventricle.  Neurosurgery consulted and EVD placed (3/20)  ICP goal <20  EVD open at 10  Repeat CT 3/20 showed decreased ventricular size.  Cytotoxic Edema  CT head Left cerebellar infarct with abundant cytotoxic edema demonstrates new petechial hemorrhage.  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  TTE: EF 40%, LV large mass versus clot  Heparin IV per stroke protocol  Consider TEE once stable  Hyperlipidemia  Home meds:none  LDL 52, goal < 70  On Crestor '5mg'$  daily, no high intensity given low level of LDL  Continue statin on discharge  Diabetes type II Uncontrolled  Home meds:None  HgbA1c 8.3, goal < 7.0  CBGs  SSI  Tobacco abuse  Current smoker  Smoking cessation counseling will be provided  Other Stroke Risk Factors  Substance abuse - UDS: THC POSITIVE, patientwill beadvised to stop using   Obesity,Body mass index is 48.08 kg/m., BMI >/= 30 associated with increased stroke risk, recommend weight loss, diet and exercise as appropriate   Hx stroke(approximately 2015)  Other Active Problems  CKD stage IV, Cre 2.32-2.47-2.71-2.52  Hospital day # 5  Jesus Osborne, ACNP-BC Stroke NP  ATTENDING NOTE: I reviewed above note and agree with the assessment and plan. Pt was seen and examined.   No family at bedside.  Early morning CT repeat showed obstructive hydrocephalus please transependymal edema. Dr. CChristella Noaperformed EVD.  Currently draining at pressure 10.  On exam, patient sedated after procedure, intubated on sedation, eyes closed, not following commands. With forced eye opening, eyes in mid position, not blinking to visual threat, doll's eyes absent, not tracking, pupil left 3 mm, right 1.5 mm, nonreactive to light. Corneal reflex weakly present, gag and cough present. Breathing  over the vent.  Facial symmetry not able to test due to ET tube.  Tongue protrusion not cooperative. On pain stimulation, no significant movement in all extremities. No  babinski. Sensation, coordination and gait not tested.  For cerebral edema and obstructive hydrocephalus, patient received mannitol and 3% bolus this morning.  Sodium 154 currently, continue 3% saline '@50'$ , DC free water, sodium check every 6 hours.  Continue EVD drain.  CT repeat p.m. showed decreased ventricular size.  Continue IV heparin and Levophed for BP management.  For detailed assessment and plan, please refer to above as I have made changes wherever appropriate.   JRosalin Hawking MD PhD Stroke Neurology 10/31/2020 7:04 PM  This patient is critically ill due to large cerebellar infarct with obstructive hydrocephalus, status post EVD, LV large mass versus clot on heparin IV, respiratory failure on ventilation and at significant risk of neurological worsening, death form hydrocephalus, recurrent stroke, hemorrhagic conversion, seizure, heart failure. This patient's care requires constant monitoring of vital signs, hemodynamics, respiratory and cardiac monitoring, review of multiple databases, neurological assessment, discussion with family, other specialists and medical decision making of high complexity. I spent 40 minutes of neurocritical care time in the care of this patient.  I discussed with Dr. CChristella Noa   To contact Stroke Continuity provider, please refer to [email or web address] After hours, contact General Neurology

## 2020-10-31 NOTE — Progress Notes (Signed)
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  No Known Allergies  Patient Measurements: Height: '5\' 10"'$  (177.8 cm) Weight: (!) 148.7 kg (327 lb 13.2 oz) IBW/kg (Calculated) : 73 Heparin Dosing Weight: 107.5 kg  Vital Signs: Temp: 97.9 F (36.6 C) (03/20 0800) Temp Source: Axillary (03/20 0800) BP: 133/80 (03/20 1030) Pulse Rate: 91 (03/20 1030)  Labs: Recent Labs    10/29/20 0305 10/29/20 0345 10/29/20 2000 10/30/20 0539 10/31/20 0456 10/31/20 0556 10/31/20 0757  HGB 10.4*  --   --  10.6* 10.8* 9.5* 11.9*  HCT 32.7*  --   --  35.0* 34.7* 28.0* 35.0*  PLT 402*  --   --  444* 441*  --   --   HEPARINUNFRC  --    < > 0.48 0.47 0.44  --   --   CREATININE 2.47*  --   --  2.71* 2.52*  --   --    < > = values in this interval not displayed.    Estimated Creatinine Clearance: 58.1 mL/min (A) (by C-G formula based on SCr of 2.52 mg/dL (H)).   Assessment: 72 YOM with history of HTN and CVA presented with acute CVA, s/p tPA on overnight 3/14-3/15. MRI shows acute infarcts with petechial hemorrhage. ECHO on 3/15 shows a large hyperechoic mass in left ventricle, and Pharmacy consulted to dose IV heparin using the stroke protocol. Will be conservative with dosing due to bleeding risk.  Heparin level therapeutic at 0.44 units/mL on 2300 units/hr this morning before it was turned off for EVD placement. Patient is s/p EVD placement and NSGY was ok resuming IV heparin with no bolus. No bleeding noted, Hgb 10-11s, platelets normal.  Goal of Therapy:  Heparin level 0.3-0.5 units/ml with acute CVA Monitor platelets by anticoagulation protocol: Yes   Plan:  No bolus per stroke protocol - resume heparin infusion at 2300 units/hr      Discussed plan with RN 8h heparin level Monitor daily heparin level and CBC, s/sx bleeding  Thank you for involving pharmacy in this patient's care.  Renold Genta, PharmD, BCPS Clinical  Pharmacist Clinical phone for 10/31/2020 until 3p is [phone] 10/31/2020 11:13 AM  **Pharmacist phone directory can be found on [email or web address] listed under [place]**

## 2020-10-31 NOTE — Progress Notes (Signed)
eLink Physician-Brief Progress Note Patient Name: Jesus Osborne DOB: 11/17/1980 MRN: [id number]   Date of Service  10/31/2020  HPI/Events of Note  Multiple issues: 1. ABG on 30%/PRVC 23/TV580?P 5 = 7.41/35.8/83 and CVP = 18.   eICU Interventions  Plan: 1. Increase PRVC rate to 30. 2. Repeat ABG at 7:30 AM.  3. Continue Norepinephrine IV infusion for goal MAP >= 65.      Intervention Category Major Interventions: Respiratory failure - evaluation and management;Other:  Maxxwell Edgett Cornelia Copa 10/31/2020, 6:11 AM

## 2020-11-01 DIAGNOSIS — J9600 Acute respiratory failure, unspecified whether with hypoxia or hypercapnia: Secondary | ICD-10-CM

## 2020-11-01 DIAGNOSIS — R509 Fever, unspecified: Secondary | ICD-10-CM

## 2020-11-01 LAB — BASIC METABOLIC PANEL
Anion gap: 7 (ref 5–15)
Anion gap: 7 (ref 5–15)
BUN: 52 mg/dL — ABNORMAL HIGH (ref 6–20)
BUN: 49 mg/dL — ABNORMAL HIGH (ref 6–20)
CO2: 23 mmol/L (ref 22–32)
CO2: 24 mmol/L (ref 22–32)
Calcium: 7.7 mg/dL — ABNORMAL LOW (ref 8.9–10.3)
Calcium: 8.1 mg/dL — ABNORMAL LOW (ref 8.9–10.3)
Chloride: 126 mmol/L — ABNORMAL HIGH (ref 98–111)
Chloride: 127 mmol/L — ABNORMAL HIGH (ref 98–111)
Creatinine, Ser: 2.28 mg/dL — ABNORMAL HIGH (ref 0.61–1.24)
Creatinine, Ser: 2.29 mg/dL — ABNORMAL HIGH (ref 0.61–1.24)
GFR, Estimated: 37 mL/min — ABNORMAL LOW (ref >60)
GFR, Estimated: 37 mL/min — ABNORMAL LOW (ref >60)
Glucose, Bld: 211 mg/dL — ABNORMAL HIGH (ref 70–99)
Glucose, Bld: 209 mg/dL — ABNORMAL HIGH (ref 70–99)
Potassium: 4.3 mmol/L (ref 3.5–5.1)
Potassium: 4.3 mmol/L (ref 3.5–5.1)
Sodium: 156 mmol/L — ABNORMAL HIGH (ref 135–145)
Sodium: 158 mmol/L — ABNORMAL HIGH (ref 135–145)

## 2020-11-01 LAB — HEPARIN LEVEL (UNFRACTIONATED)
Heparin Unfractionated: 0.78 IU/mL — ABNORMAL HIGH (ref 0.30–0.70)
Heparin Unfractionated: 0.65 IU/mL (ref 0.30–0.70)

## 2020-11-01 LAB — CBC
HCT: 36.5 % — ABNORMAL LOW (ref 39.0–52.0)
Hemoglobin: 10.9 g/dL — ABNORMAL LOW (ref 13.0–17.0)
MCH: 25.3 pg — ABNORMAL LOW (ref 26.0–34.0)
MCHC: 29.9 g/dL — ABNORMAL LOW (ref 30.0–36.0)
MCV: 84.7 fL (ref 80.0–100.0)
Platelets: 410 10*3/uL — ABNORMAL HIGH (ref 150–400)
RBC: 4.31 MIL/uL (ref 4.22–5.81)
RDW: 17.9 % — ABNORMAL HIGH (ref 11.5–15.5)
WBC: 9.4 10*3/uL (ref 4.0–10.5)
nRBC: 1 % — ABNORMAL HIGH (ref 0.0–0.2)

## 2020-11-01 LAB — GLUCOSE, CAPILLARY
Glucose-Capillary: 167 mg/dL — ABNORMAL HIGH (ref 70–99)
Glucose-Capillary: 181 mg/dL — ABNORMAL HIGH (ref 70–99)
Glucose-Capillary: 168 mg/dL — ABNORMAL HIGH (ref 70–99)
Glucose-Capillary: 171 mg/dL — ABNORMAL HIGH (ref 70–99)
Glucose-Capillary: 160 mg/dL — ABNORMAL HIGH (ref 70–99)
Glucose-Capillary: 174 mg/dL — ABNORMAL HIGH (ref 70–99)

## 2020-11-01 LAB — SODIUM
Sodium: 158 mmol/L — ABNORMAL HIGH (ref 135–145)
Sodium: 161 mmol/L (ref 135–145)
Sodium: 160 mmol/L — ABNORMAL HIGH (ref 135–145)

## 2020-11-01 MED ORDER — CARVEDILOL 3.125 MG PO TABS
6.2500 mg | ORAL_TABLET | Freq: Two times a day (BID) | ORAL | Status: DC
  Administered 2020-11-01 – 2020-11-02 (×4): 6.25 mg
  Filled 2020-11-01 (×3): qty 2

## 2020-11-01 MED ORDER — SODIUM CHLORIDE 0.9 % IV SOLN: INTRAVENOUS | Status: DC

## 2020-11-01 MED ORDER — INSULIN DETEMIR 100 UNIT/ML ~~LOC~~ SOLN
8.0000 [IU] | Freq: Two times a day (BID) | SUBCUTANEOUS | Status: DC
  Administered 2020-11-01 (×2): 8 [IU] via SUBCUTANEOUS
  Filled 2020-11-01 (×4): qty 0.08

## 2020-11-01 MED ORDER — CARVEDILOL 3.125 MG PO TABS: 6.2500 mg | ORAL_TABLET | Freq: Two times a day (BID) | ORAL | Status: DC

## 2020-11-01 NOTE — Progress Notes (Signed)
OT Cancellation Note  Patient Details Name: Jesus Osborne MRN: [id number] DOB: [birth year]   Cancelled Treatment:    Reason Eval/Treat Not Completed: Patient not medically ready. Signing off at this time. Please reorder when medically appropriate. Thank you.   Ramond Dial, OT/L   Acute OT Clinical Specialist Acute Rehabilitation Services Pager [phone] Office [phone]  11/01/2020, 6:47 AM

## 2020-11-01 NOTE — Progress Notes (Addendum)
STROKE TEAM PROGRESS NOTE   INTERVAL HISTORY Had EVD placed by NeuroSurgery yesterday. Tolerated procedure well.  He is still intubated. He has waxing and waning mental status. He required multiple promptings to awaken. He then followed some commands. If he continues to have changing mental status will get repeat CT tomorrow. Unsure if family will want a family meeting today as yesterday they were contacted for consent to place EVD. Will need to continue to monitor response to discuss patients future- PEG tube, Trachea, and 24/7 nursing care. Will continue to monitor.  Neurosurgery, CCM are following.  Vitals:   11/01/20 0500 11/01/20 0600 11/01/20 0700 11/01/20 0800  BP: (!) 153/110 (!) 158/111 (!) 161/115 (!) 165/130  Pulse: 90 93 95 (!) 108  Resp: (!) 0 (!) 0 (!) 0 (!) 24  Temp:      TempSrc:      SpO2: 100% 100% 100% 100%  Weight:      Height:       CBC:  Recent Labs  Lab 11/04/2020 2254 10/26/20 0006 10/31/20 0456 10/31/20 0556 10/31/20 0757 11/01/20 0540  WBC 15.9*   < > 10.1  --   --  9.4  NEUTROABS 13.4*  --   --   --   --   --   HGB 11.8*   < > 10.8*   < > 11.9* 10.9*  HCT 35.6*   < > 34.7*   < > 35.0* 36.5*  MCV 77.7*   < > 83.2  --   --  84.7  PLT 476*   < > 441*  --   --  410*   < > = values in this interval not displayed.   Basic Metabolic Panel:  Recent Labs  Lab 10/28/20 0442 10/28/20 1002 10/28/20 1543 10/28/20 1745 10/31/20 0456 10/31/20 0556 10/31/20 0757 10/31/20 1123 11/01/20 0102 11/01/20 0540  NA 146*   < >  --    < > 155*   < > 154*   < > 158* 156*  K 4.4  --   --    < > 4.5   < > 4.8  --   --  4.3  CL 114*  --   --    < > 121*  --   --   --   --  126*  CO2 27  --   --    < > 25  --   --   --   --  23  GLUCOSE 180*  --   --    < > 114*  --   --   --   --  211*  BUN 51*  --   --    < > 60*  --   --   --   --  52*  CREATININE 2.32*  --   --    < > 2.52*  --   --   --   --  2.28*  CALCIUM 7.5*  --   --    < > 7.8*  --   --   --   --  7.7*   MG 2.3  --  2.2  --   --   --   --   --   --   --   PHOS 4.4  --  4.3  --   --   --   --   --   --   --    < > = values in this interval  not displayed.   Lipid Panel:  Recent Labs  Lab 10/26/20 0708 10/27/20 0322 10/30/20 0539  CHOL 101  --   --   TRIG 127   < > 108  HDL 24*  --   --   CHOLHDL 4.2  --   --   VLDL 25  --   --   LDLCALC 52  --   --    < > = values in this interval not displayed.   HgbA1c:  Recent Labs  Lab 10/26/20 0045  HGBA1C 8.3*   Urine Drug Screen:  Recent Labs  Lab 10/26/20 0849  LABOPIA NONE DETECTED  COCAINSCRNUR NONE DETECTED  LABBENZ NONE DETECTED  AMPHETMU NONE DETECTED  THCU POSITIVE*  LABBARB NONE DETECTED    Alcohol Level  Recent Labs  Lab 10/21/2020 2254  ETH <10    IMAGING past 24 hours CT HEAD WO CONTRAST  Result Date: 10/31/2020 CLINICAL DATA:  Hydrocephalus.  Ventriculostomy placement. EXAM: CT HEAD WITHOUT CONTRAST TECHNIQUE: Contiguous axial images were obtained from the base of the skull through the vertex without intravenous contrast. COMPARISON:  Earlier same day FINDINGS: Brain: Ventriculostomy placed from a right frontal approach, entering the frontal horn of the right lateral ventricle, passing through the foramen of Monro with the tip in the inferior third ventricle. There is reduction in size of the lateral and third ventricles. Very small amount of subdural blood and air at the site ventriculostomy placement. No significant intraparenchymal hemorrhage along the ventriculostomy. Extensive infarction within the cerebellum as seen previously with low-density, swelling and obstruction of the fourth ventricle. Petechial blood products without measurable hematoma. Acute infarction affecting the thalami left more than right as seen previously. No hemorrhagic transformation. Acute infarction in the left parietal cortical and subcortical brain as seen previously. Minimal petechial blood products but no frank hematoma. No new brain  insult is identified otherwise. Vascular: Negative Skull: Otherwise negative Sinuses/Orbits: Clear/normal Other: None IMPRESSION: 1. Ventriculostomy placed from a right frontal approach, entering the frontal horn of the right lateral ventricle, passing through the foramen of Monro with the tip in the inferior third ventricle. There is reduction in size of the lateral and third ventricles. Very small amount of subdural blood and air at the site of ventriculostomy placement. 2. Extensive infarction within the cerebellum as seen previously. Petechial blood products without measurable hematoma. 3. Acute infarction in the left parietal cortical and subcortical brain as seen previously. Minimal petechial blood products but no frank hematoma. 4. Bilateral thalamic infarctions as seen previously, left more than right. Electronically Signed   By: Nelson Chimes M.D.   On: 10/31/2020 15:53    PHYSICAL EXAM Blood pressure (!) 165/130, pulse (!) 108, temperature 98.7 F (37.1 C), temperature source Oral, resp. rate (!) 24, height '5\' 10"'$  (1.778 m), weight (!) 144.9 kg, SpO2 100 %.  General: sedated and intubated, obese african [place] male, no apparent distress  Lungs: Symmetrical Chest rise, no labored breathing  Cardio: Regular Rate and Rhythm  Abdomen: Soft, non-tender  Neuro: Sedated and intubated  Able to follow some commands occasionally requiring multiple promts. Cranial Nerves: II:  pupils round Left slightly larger than RIght, reactive to light and accommodation III,IV, VI: has mild bilateral exotropia VIII: hearing normal bilaterally   Motor: Moves Left LE against gravity. Moves Left hand with prompting. Moves Right LE to noxious stimuli. Moves Right fingers some. Tone and bulk:normal tone throughout; no atrophy noted   ASSESSMENT/PLAN Jesus Osborne is a 40  y.o. male with history of HTN, prior stroke(about 7 years ago per patient) with mild R sided weakness, uses a cane to walk who  presents with R arm numbness and worsening of his R sided weakness. He was sitting at his porch eating salad when he had sudden onset R sided weakness, mostly with his arm and also his leg. He is R handed. Writes with his R hand. Uses a cane but can walk around fine. Reports had a stroke 7 years ago with residual mild R sided weakness.Also endorses shortness of breath since yesterday which has gotten worse today.  Is able to move some with multiple prompts. Continues to have waxing and waning mental status. Will consider repeat CT tomorrow if mental status does not stabilize. Will continue to monitor for improvements. Family will need to determine extent of care going forward.   Stroke - LeftSuperiorCerebellum,Artery of[place], andLeftFrontal Parietal Cortexinfarcts s/p tPA, secondary tocardiogenic emboli left ventricular clot versus mass   CT showed left MCA infarct, old lacunar infarct at left thalamus and left cerebellum.   CTA head and neck negative for LVO but multifocal pneumonia.   MRI showed left MCA large infarct, bilateral thalamus left MCA small infarcts.   CT head 3/18 showed stable infarct but mild hydrocephalus.   CT head 3/20 Acute lateral and 3rd ventriculomegaly with transependymal edema secondary to loss of basilar cisterns and obstructed 4th ventricle.  2D Echo- EF 40%, LV largemass versus clot.   EEG negative for seizure.  [id number]  HgbA1c8.3  VTE prophylaxis - heparin IV  No antithromboticprior to admission, now on IV Heparin   Therapy recommendations:Pending  Disposition:Pending  Obstructive Hydrocephalus  CT head 3/20 Acute lateral and 3rd ventriculomegaly with transependymal edema secondary to loss of basilar cisterns and obstructed 4th ventricle.  Neurosurgery consulted and EVD placed (3/20)  ICP goal <20  EVD open at 10  Repeat CT 3/20 showed decreased ventricular size.  Cytotoxic Edema  CT head Left cerebellar infarct  with abundant cytotoxic edema demonstrates new petechial hemorrhage.  Na Q6h  Goal Na 155-160  EVD placed 3/20  Na 156->158->161  3% Saline at 75 cc/hr -> NS @ 25  Hypotension->hypertension  Hypotensive after increased sedation  MAP goal >65  3/21 became hypertensive  Off Levophed now  Resume Coreg  Acute Respiratory Failure  Secondary to stroke  Sedated on vent  CCM onboard  PossibleRight Lung Pneumonia on CTA  Unasyn-> Ancef  Tmax 100.7->100.9  Left Ventricle large mass vs Clot  TTE: EF 40%, LV largemass versus clot  Heparin IV per stroke protocol  Consider TEE once stable and after GOC discussion  Hyperlipidemia  Home meds:none  LDL 52, goal < 70  On Crestor '5mg'$  daily, no high intensity given low level of LDL  Continue statin on discharge  Diabetes type II Uncontrolled  Home meds:None  HgbA1c 8.3, goal < 7.0  CBGs  SSI  Tobacco abuse  Current smoker  Smoking cessation counseling will be provided  Other Stroke Risk Factors  Substance abuse - UDS: THC POSITIVE, patientwill beadvised to stop using   Obesity,Body mass index is 48.08 kg/m., BMI >/= 30 associated with increased stroke risk, recommend weight loss, diet and exercise as appropriate   Hx stroke(approximately 2015)  Other Active Problems  CKD stage IV, Cre 2.32-2.47-2.71-2.52->2.29   Hospital day # [place] MD Resident  ATTENDING NOTE: I reviewed above note and agree with the assessment and plan. Pt was seen and examined.   No  family at bedside.  Patient still intubated on ventilation, eyes barely open with painful stimuli, not following commands, not blinking to visual threat.  However, per RN, he was able to follow commands and right leg and moving right toes.  Sodium 161, DC 3% saline, put on normal saline.  EVD continue to drain.  Yesterday CT showed decreased ventricular size.  Low-grade fever intermittently, currently on Ancef.   CXR pending in a.m.  Consider repeat CT in a.m. if neuro no significant improvement, also will need consider GOC discussion with patient sisters.  For detailed assessment and plan, please refer to above as I have made changes wherever appropriate.   Rosalin Hawking, MD PhD Stroke Neurology 11/01/2020 7:06 PM  This patient is critically ill due to large left cerebellar infarct with cerebellar edema, respiratory failure needing ventilation, obstructive hydrocephalus needing EVD and at significant risk of neurological worsening, death form recurrent stroke, hemorrhagic conversion, hydrocephalus, seizure and sepsis. This patient's care requires constant monitoring of vital signs, hemodynamics, respiratory and cardiac monitoring, review of multiple databases, neurological assessment, discussion with family, other specialists and medical decision making of high complexity. I spent 35 minutes of neurocritical care time in the care of this patient.   To contact Stroke Continuity provider, please refer to [email or web address] After hours, contact General Neurology

## 2020-11-01 NOTE — Progress Notes (Addendum)
Patient ID: Jesus Osborne, male   DOB: [birth year], 40 y.o.   MRN: [id number] BP (!) 141/107   Pulse 93   Temp 99 F (37.2 C) (Axillary)   Resp (!) 29   Ht '5\' 10"'$  (1.778 m)   Wt (!) 144.9 kg   SpO2 100%   BMI 45.84 kg/m  Arouses with stimulation ventricular catheter draining well Surprised by the blood tinted csf. No blood seen on yesterdays scan. Will when the time comes need to be off anticoagulants before removing ventricular catheter.

## 2020-11-01 NOTE — Progress Notes (Signed)
Date and time results received: 11/01/20 1429    Test: Sodium   Critical Value: 161  Name of Provider Notified: Dr Erlinda Hong  Orders Received to d/c 3% saline  Will continue to monitor.  Hiram Gash RN

## 2020-11-01 NOTE — Progress Notes (Signed)
SLP Cancellation Note  Patient Details Name: Jesus Osborne MRN: [id number] DOB: 16-Mar-1981   Cancelled treatment:        Vent- full support- per critical care there is a good chance of needing trach. Will follow.    Houston Siren 11/01/2020, 10:33 AM  Orbie Pyo Colvin Caroli.Ed Risk analyst [phone] Office [phone]

## 2020-11-01 NOTE — Progress Notes (Signed)
ANTICOAGULATION CONSULT NOTE - Follow Up Consult  Pharmacy Consult:  IV Heparin Indication:  Mobile mass in left ventricle  No Known Allergies  Patient Measurements: Height: '5\' 10"'$  (177.8 cm) Weight: (!) 144.9 kg (319 lb 7.1 oz) IBW/kg (Calculated) : 73 Heparin Dosing Weight: 107.5 kg  Vital Signs: Temp: 98.2 F (36.8 C) (03/21 1600) Temp Source: Axillary (03/21 1600) BP: 149/106 (03/21 1600) Pulse Rate: 94 (03/21 1600)  Labs: Recent Labs    10/30/20 0539 10/31/20 0456 10/31/20 0556 10/31/20 0757 10/31/20 1811 11/01/20 0540 11/01/20 0842 11/01/20 0852 11/01/20 1645  HGB 10.6* 10.8* 9.5* 11.9*  --  10.9*  --   --   --   HCT 35.0* 34.7* 28.0* 35.0*  --  36.5*  --   --   --   PLT 444* 441*  --   --   --  410*  --   --   --   HEPARINUNFRC 0.47 0.44  --   --  0.33  --   --  0.78* 0.65  CREATININE 2.71* 2.52*  --   --   --  2.28* 2.29*  --   --     Estimated Creatinine Clearance: 63 mL/min (A) (by C-G formula based on SCr of 2.29 mg/dL (H)).   Assessment: 40 yr old male with history of HTN and CVA presented with acute CVA; S/P tPA on overnight 3/14-3/15. MRI showed acute infarcts with petechial hemorrhage. ECHO on 3/15 showed large hyperechoic mass in left ventricle. Pharmacy was consulted to dose IV heparin using the stroke protocol. Will be conservative with dosing due to bleeding risk.  Pt is S/P EVD placement on 3/20. NSGY ok'd to resume IV heparin with no bolus.  Heparin level ~7 hrs after heparin infusion was decreased to 2100 units/hr was 0.65 units/ml, which is above the goal range for this pt. CBC stable. No bleeding or issues with infusion per discussion with RN.  Goal of Therapy:  Heparin level 0.3-0.5 units/ml with acute CVA Monitor platelets by anticoagulation protocol: Yes   Plan:  Reduce heparin infusion to 1900 units/hr Check 6-hr heparin level  Monitor daily heparin level, CBC Monitor for bleeding  Gillermina Hu, PharmD, BCPS, [facility] Clinical  Pharmacist 11/01/2020 5:48 PM

## 2020-11-01 NOTE — Progress Notes (Signed)
NAME:  Jesus Osborne, MRN:  [id number], DOB:  22-Apr-1981, LOS: 6 ADMISSION DATE:  11/02/2020, CONSULTATION DATE:  10/26/20 REFERRING MD:  Lorrin Goodell, CHIEF COMPLAINT:  AMS   Brief History:  Jesus Osborne is a 40 y.o. male who was admitted 3/14 with CVA s/p tPA.  While in [place], had hypoxia requiring NRB then 2L O2 via [place].  Later had change in mental status with minimal response; therefore, intubated before returning to CT.  History of Present Illness:  Pt is encephelopathic; therefore, this HPI is obtained from chart review. Jesus Osborne is a 40 y.o. male who has a PMH including but not limited to prior CVA, HTN, THC and tobacco dependence (see "past medical history" for rest).  He presented to [facility] ED 3/14 with R arm numbness and R sided weakness.  He was taken to CT which was concerning for evolving L MCA infarct.  HE received tPA at 002 (slighly delay due to difficult IV access).  While in CT, he had desaturation requiring NRB and later placed on 2L O2 via [place]. While in ED, later had change in mental status around 0115 with left gaze deviation and decrease in alertness.  Decision made to intubate prior to taking back to CT for repeat scan.  Past Medical History:  has Acute ischemic left MCA stroke ([place]) on their problem list.  Significant Hospital Events:  -3/14 > admit. -3/15 > intubated MRI head 3/15 (06:40)>  1.Acute infarcts in the left left superior cerebellum, artery of [place], and left frontal parietal cortex. 2. Petechial hemorrhage in the left parietal region. 3. Remote hypertensive pattern hemorrhages. -Echo 3/15>>20x64m moderately mobile mass on inferior wall of the LV, increased echogenicity likely representing thrombus but cannot exclude neoplasm, EF 40% -CT head 3/15>>posterior mass effect with partial effacement of 4th ventricle but no obstructive hydrocephalus -EEG 3/15-3/16>> moderate diffuse encephalopathy. No epileptiform discharges -CT head 3/18>>  worsened mass effect almost entirely effacing 4th ventricle with increased ventriculomegaly 15 (01:56)> no acute intracranial hemorrhage - 3/15 started on 3% to mitigate edema - 3/17 family meeting with neurology.  Family aware and realistic regarding possibility for poor neurological recovery.  Plan to observe for recovery over the next 4 days. -3/18 more awake today - 3/19 exam decline overnight with worsening hydrocephalus on CT  -3/20 EVD placed after heparin washout.    Interim History / Subjective:  No events. Mental status, BP improved with EVD.  Objective   Blood pressure (!) 158/111, pulse 93, temperature 98.7 F (37.1 C), temperature source Oral, resp. rate (!) 0, height '5\' 10"'$  (1.778 m), weight (!) 144.9 kg, SpO2 100 %. CVP:  [4 mmHg-14 mmHg] 4 mmHg  Vent Mode: PRVC FiO2 (%):  [30 %-40 %] 30 % Set Rate:  [30 bmp] 30 bmp Vt Set:  [580 mL] 580 mL PEEP:  [5 cmH20] 5 cmH20 Plateau Pressure:  [17 cmH20-25 cmH20] 21 cmH20   Intake/Output Summary (Last 24 hours) at 11/01/2020 [id number] data filed at 11/01/2020 0600 Gross per 24 hour  Intake 3142.05 ml  Output 5754 ml  Net -2611.95 ml   Filed Weights   10/30/20 0400 10/31/20 0400 11/01/20 0400  Weight: (!) 143 kg (!) 148.7 kg (!) 144.9 kg    Examination: Constitutional: young man on ventilator  Eyes: R>L pupils, reactive Ears, nose, mouth, and throat: ETT in place, minimal secretions Cardiovascular: RRR, ext warm Respiratory: scattered mechanical breath sounds, triggers vent Gastrointestinal: +BS, rectal tube in place Skin: No  rashes, normal turgor Neurologic:  Moves all 4 ext to command but weak +cough/gag Unable to track R gaze preference Psychiatric: RASS 0   Sodium 154>>156 CBC stable Tmax 38.3 x 1 3/20 Resolved Hospital Problem list   Hyponatremia  Assessment & Plan:    Left Frontal Parietal, Left Superior Cerebellar Infarction s/p TPA Complicated by hydrocephalus s/p EVD, mild hemorrhagic  conversion 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, EF 40%, 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 Critically ill due to acute hypoxic Respiratory Failure AKI, ?if has CKD HTN Diabetes Mellitus Hypernatremia, iatrogenic  Plan:  - EVD drainage and hypertonic saline per NSGY - Add levemir, continue SSI - Continue secondary stroke prevention - SBT, would like to see him a little stronger before extubation trial; strong chance he will need trachestomy - Continue Coreg for ischemic heart disease; already on heparin gtt so I guess asa not really needed; defer to neurology  Best practice:  Diet: TF Pain/Anxiety/Delirium protocol (if indicated): fentanyl, precedex VAP protocol (if indicated): in place DVT prophylaxis: heparin gtt GI prophylaxis: PPI Glucose control: SSI + levemir Mobility: BR Code Status: full Family Communication: per primary Disposition: ICU pending vent liberation    Patient critically ill due to respiratory failure Interventions to address this today ventilator titration Risk of deterioration without these interventions is high  I personally spent 34 minutes providing critical care not including any separately billable procedures  Erskine Emery MD [facility] Critical Care  Prefer epic messenger for cross cover needs If after hours, please call E-link

## 2020-11-01 NOTE — Progress Notes (Signed)
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  No Known Allergies  Patient Measurements: Height: '5\' 10"'$  (177.8 cm) Weight: (!) 144.9 kg (319 lb 7.1 oz) IBW/kg (Calculated) : 73 Heparin Dosing Weight: 107.5 kg  Vital Signs: Temp: 99.2 F (37.3 C) (03/21 0800) Temp Source: Axillary (03/21 0800) BP: 165/130 (03/21 0800) Pulse Rate: 108 (03/21 0800)  Labs: Recent Labs    10/30/20 0539 10/31/20 0456 10/31/20 0556 10/31/20 0757 10/31/20 1811 11/01/20 0540 11/01/20 0852  HGB 10.6* 10.8* 9.5* 11.9*  --  10.9*  --   HCT 35.0* 34.7* 28.0* 35.0*  --  36.5*  --   PLT 444* 441*  --   --   --  410*  --   HEPARINUNFRC 0.47 0.44  --   --  0.33  --  0.78*  CREATININE 2.71* 2.52*  --   --   --  2.28*  --     Estimated Creatinine Clearance: 63.3 mL/min (A) (by C-G formula based on SCr of 2.28 mg/dL (H)).   Assessment: 57 YOM with history of HTN and CVA presented with acute CVA, s/p tPA on overnight 3/14-3/15. MRI shows acute infarcts with petechial hemorrhage. ECHO on 3/15 shows large hyperechoic mass in left ventricle, and Pharmacy consulted to dose IV heparin using the stroke protocol. Will be conservative with dosing due to bleeding risk.  S/p EVD placement on 3/20. NSGY ok'd to resume IV heparin with no bolus.  Heparin level resulted supratherapeutic at 0.78 this AM after previously therapeutic at same rate. CBC stable. SCr trending down. No bleeding or issues with infusion per discussion with RN.  Goal of Therapy:  Heparin level 0.3-0.5 units/ml with acute CVA Monitor platelets by anticoagulation protocol: Yes   Plan:  Reduce heparin infusion to 2100 units/hr Check 6hr heparin level  Monitor daily CBC, s/sx bleeding   Arturo Morton, PharmD, BCPS Please check AMION for all [facility] contact numbers Clinical Pharmacist 11/01/2020 9:43 AM

## 2020-11-02 ENCOUNTER — Inpatient Hospital Stay (HOSPITAL_COMMUNITY): Payer: Medicaid Other

## 2020-11-02 DIAGNOSIS — I63442 Cerebral infarction due to embolism of left cerebellar artery: Secondary | ICD-10-CM

## 2020-11-02 DIAGNOSIS — R569 Unspecified convulsions: Secondary | ICD-10-CM

## 2020-11-02 LAB — COMPREHENSIVE METABOLIC PANEL
ALT: 126 U/L — ABNORMAL HIGH (ref 0–44)
AST: 196 U/L — ABNORMAL HIGH (ref 15–41)
Albumin: 1.7 g/dL — ABNORMAL LOW (ref 3.5–5.0)
Alkaline Phosphatase: 182 U/L — ABNORMAL HIGH (ref 38–126)
Anion gap: 5 (ref 5–15)
BUN: 61 mg/dL — ABNORMAL HIGH (ref 6–20)
CO2: 22 mmol/L (ref 22–32)
Calcium: 8 mg/dL — ABNORMAL LOW (ref 8.9–10.3)
Chloride: 128 mmol/L — ABNORMAL HIGH (ref 98–111)
Creatinine, Ser: 2.44 mg/dL — ABNORMAL HIGH (ref 0.61–1.24)
GFR, Estimated: 34 mL/min — ABNORMAL LOW (ref >60)
Glucose, Bld: 170 mg/dL — ABNORMAL HIGH (ref 70–99)
Potassium: 4.3 mmol/L (ref 3.5–5.1)
Sodium: 155 mmol/L — ABNORMAL HIGH (ref 135–145)
Total Bilirubin: 0.6 mg/dL (ref 0.3–1.2)
Total Protein: 6.5 g/dL (ref 6.5–8.1)

## 2020-11-02 LAB — GLUCOSE, CAPILLARY
Glucose-Capillary: 172 mg/dL — ABNORMAL HIGH (ref 70–99)
Glucose-Capillary: 167 mg/dL — ABNORMAL HIGH (ref 70–99)
Glucose-Capillary: 158 mg/dL — ABNORMAL HIGH (ref 70–99)
Glucose-Capillary: 165 mg/dL — ABNORMAL HIGH (ref 70–99)
Glucose-Capillary: 131 mg/dL — ABNORMAL HIGH (ref 70–99)
Glucose-Capillary: 215 mg/dL — ABNORMAL HIGH (ref 70–99)

## 2020-11-02 LAB — POCT I-STAT 7, (LYTES, BLD GAS, ICA,H+H)
Acid-base deficit: 2 mmol/L (ref 0.0–2.0)
Bicarbonate: 22.1 mmol/L (ref 20.0–28.0)
Calcium, Ion: 1.24 mmol/L (ref 1.15–1.40)
HCT: 33 % — ABNORMAL LOW (ref 39.0–52.0)
Hemoglobin: 11.2 g/dL — ABNORMAL LOW (ref 13.0–17.0)
O2 Saturation: 96 %
Patient temperature: 101.2
Potassium: 4.3 mmol/L (ref 3.5–5.1)
Sodium: 166 mmol/L (ref 135–145)
TCO2: 23 mmol/L (ref 22–32)
pCO2 arterial: 37.1 mmHg (ref 32.0–48.0)
pH, Arterial: 7.389 (ref 7.350–7.450)
pO2, Arterial: 91 mmHg (ref 83.0–108.0)

## 2020-11-02 LAB — BASIC METABOLIC PANEL
BUN: 56 mg/dL — ABNORMAL HIGH (ref 6–20)
CO2: 23 mmol/L (ref 22–32)
Calcium: 8 mg/dL — ABNORMAL LOW (ref 8.9–10.3)
Chloride: >130 mmol/L (ref 98–111)
Creatinine, Ser: 2.22 mg/dL — ABNORMAL HIGH (ref 0.61–1.24)
GFR, Estimated: 38 mL/min — ABNORMAL LOW (ref >60)
Glucose, Bld: 191 mg/dL — ABNORMAL HIGH (ref 70–99)
Potassium: 4 mmol/L (ref 3.5–5.1)
Sodium: 160 mmol/L — ABNORMAL HIGH (ref 135–145)

## 2020-11-02 LAB — HEPARIN LEVEL (UNFRACTIONATED)
Heparin Unfractionated: 0.52 IU/mL (ref 0.30–0.70)
Heparin Unfractionated: 0.54 IU/mL (ref 0.30–0.70)
Heparin Unfractionated: <0.1 IU/mL — ABNORMAL LOW (ref 0.30–0.70)

## 2020-11-02 LAB — CBC
HCT: 36.2 % — ABNORMAL LOW (ref 39.0–52.0)
Hemoglobin: 10.6 g/dL — ABNORMAL LOW (ref 13.0–17.0)
MCH: 24.9 pg — ABNORMAL LOW (ref 26.0–34.0)
MCHC: 29.3 g/dL — ABNORMAL LOW (ref 30.0–36.0)
MCV: 85.2 fL (ref 80.0–100.0)
Platelets: 369 10*3/uL (ref 150–400)
RBC: 4.25 MIL/uL (ref 4.22–5.81)
RDW: 18.6 % — ABNORMAL HIGH (ref 11.5–15.5)
WBC: 10.4 10*3/uL (ref 4.0–10.5)
nRBC: 0.5 % — ABNORMAL HIGH (ref 0.0–0.2)

## 2020-11-02 LAB — SODIUM
Sodium: 161 mmol/L (ref 135–145)
Sodium: 159 mmol/L — ABNORMAL HIGH (ref 135–145)
Sodium: 160 mmol/L — ABNORMAL HIGH (ref 135–145)
Sodium: 159 mmol/L — ABNORMAL HIGH (ref 135–145)

## 2020-11-02 LAB — MAGNESIUM: Magnesium: 2.3 mg/dL (ref 1.7–2.4)

## 2020-11-02 LAB — PHOSPHORUS: Phosphorus: 3.8 mg/dL (ref 2.5–4.6)

## 2020-11-02 MED ORDER — GLUCERNA 1.5 CAL PO LIQD
1000.0000 mL | ORAL | Status: DC
  Administered 2020-11-02 – 2020-11-05 (×3): 1000 mL
  Filled 2020-11-02 (×6): qty 1000

## 2020-11-02 MED ORDER — LACTATED RINGERS IV BOLUS
500.0000 mL | Freq: Once | INTRAVENOUS | Status: AC
  Administered 2020-11-02: 500 mL via INTRAVENOUS

## 2020-11-02 MED ORDER — MANNITOL 20 % IV SOLN
37.5000 g | Freq: Once | Status: AC
  Administered 2020-11-02: 37.5 g via INTRAVENOUS
  Filled 2020-11-02: qty 187.5

## 2020-11-02 MED ORDER — NOREPINEPHRINE 4 MG/250ML-% IV SOLN
0.0000 ug/min | INTRAVENOUS | Status: DC
  Administered 2020-11-03: 8 ug/min via INTRAVENOUS
  Administered 2020-11-03: 16 ug/min via INTRAVENOUS
  Administered 2020-11-03: 10 ug/min via INTRAVENOUS
  Administered 2020-11-03: 8 ug/min via INTRAVENOUS
  Administered 2020-11-04: 15 ug/min via INTRAVENOUS
  Administered 2020-11-04: 12 ug/min via INTRAVENOUS
  Administered 2020-11-04: 15 ug/min via INTRAVENOUS
  Administered 2020-11-04 (×2): 16 ug/min via INTRAVENOUS
  Administered 2020-11-05: 15 ug/min via INTRAVENOUS
  Administered 2020-11-05: 18 ug/min via INTRAVENOUS
  Administered 2020-11-05 (×2): 20 ug/min via INTRAVENOUS
  Filled 2020-11-02 (×13): qty 250

## 2020-11-02 MED ORDER — NOREPINEPHRINE 4 MG/250ML-% IV SOLN
INTRAVENOUS | Status: AC
  Administered 2020-11-02: 10 ug/min via INTRAVENOUS
  Filled 2020-11-02: qty 250

## 2020-11-02 MED ORDER — INSULIN DETEMIR 100 UNIT/ML ~~LOC~~ SOLN
12.0000 [IU] | Freq: Two times a day (BID) | SUBCUTANEOUS | Status: DC
  Administered 2020-11-02 – 2020-11-05 (×7): 12 [IU] via SUBCUTANEOUS
  Filled 2020-11-02 (×8): qty 0.12

## 2020-11-02 MED ORDER — MANNITOL 25 % IV SOLN
INTRAVENOUS | Status: AC
  Filled 2020-11-02: qty 150

## 2020-11-02 MED ORDER — FREE WATER
300.0000 mL | Freq: Four times a day (QID) | Status: DC
  Administered 2020-11-02 – 2020-11-03 (×8): 300 mL

## 2020-11-02 NOTE — Progress Notes (Signed)
ANTICOAGULATION CONSULT NOTE - Follow Up Consult  Pharmacy Consult for Heparin > on hold Indication:  Mobile mass in left ventricle  No Known Allergies  Patient Measurements: Height: '5\' 10"'$  (177.8 cm) Weight: (!) 144.9 kg (319 lb 7.1 oz) IBW/kg (Calculated) : 73 Heparin Dosing Weight: 107.5 kg  Vital Signs: Temp: 101.2 F (38.4 C) (03/22 2000) Temp Source: Axillary (03/22 2000) BP: 130/87 (03/22 2130) Pulse Rate: 88 (03/22 2130)  Labs: Recent Labs    10/31/20 0456 10/31/20 0556 11/01/20 0540 11/01/20 0842 11/01/20 0852 11/02/20 0000 11/02/20 0613 11/02/20 0830 11/02/20 1230 11/02/20 2023 11/02/20 2100 11/02/20 2102  HGB 10.8*   < > 10.9*  --   --   --  10.6*  --   --   --   --  11.2*  HCT 34.7*   < > 36.5*  --   --   --  36.2*  --   --   --   --  33.0*  PLT 441*  --  410*  --   --   --  369  --   --   --   --   --   HEPARINUNFRC 0.44   < >  --   --    < > 0.52  --   --  0.54  --  <0.10*  --   CREATININE 2.52*  --  2.28* 2.29*  --   --   --  2.22*  --  2.44*  --   --    < > = values in this interval not displayed.    Estimated Creatinine Clearance: 59.1 mL/min (A) (by C-G formula based on SCr of 2.44 mg/dL (H)).   Assessment: 40 yr old male with history of HTN and CVA presented with acute CVA; S/P tPA on overnight 3/14-3/15. MRI showed acute infarcts with petechial hemorrhage. ECHO on 3/15 showed large hyperechoic mass in left ventricle. Pharmacy was consulted to dose IV heparin using the stroke protocol. Will be conservative with dosing due to bleeding risk.  Pt is S/P EVD placement on 3/20. NSGY ok'd to resume IV heparin with no bolus.  Heparin level remains slightly supratherapeutic (0.54) after rate decrease earlier today. Neurosurgery noted blood-tinged CSF. CT head today showed small volume blood tracking along catheter, small volume 3rd and R lateral IVH, increasing petechial and perhaps early malignant hemorrhagic transformation of L cerebellar infarct.  Neurosurgery/Neuro okay with continuing heparin for now. No issues with infusion per RN.  PM f/u - heparin level now undetectable, but heparin turned off earlier this evening.  Goal of Therapy:  Heparin level 0.3-0.5 units/ml with acute CVA Monitor platelets by anticoagulation protocol: Yes   Plan:  F/u plans to resume heparin.  [place] Crane, Roylene Reason, BCCP Clinical Pharmacist  11/02/2020 10:49 PM   [facility] pharmacy phone numbers are listed on [email or web address]

## 2020-11-02 NOTE — Progress Notes (Signed)
ANTICOAGULATION CONSULT NOTE - Follow Up Consult  Pharmacy Consult for Heparin Indication:  Mobile mass in left ventricle  No Known Allergies  Patient Measurements: Height: '5\' 10"'$  (177.8 cm) Weight: (!) 144.9 kg (319 lb 7.1 oz) IBW/kg (Calculated) : 73 Heparin Dosing Weight: 107.5 kg  Vital Signs: Temp: 98.6 F (37 C) (03/22 0000) Temp Source: Axillary (03/22 0000) BP: 178/133 (03/21 2200) Pulse Rate: 103 (03/21 2303)  Labs: Recent Labs    10/30/20 0539 10/31/20 0456 10/31/20 0556 10/31/20 0757 10/31/20 1811 11/01/20 0540 11/01/20 0842 11/01/20 0852 11/01/20 1645 11/02/20 0000  HGB 10.6* 10.8* 9.5* 11.9*  --  10.9*  --   --   --   --   HCT 35.0* 34.7* 28.0* 35.0*  --  36.5*  --   --   --   --   PLT 444* 441*  --   --   --  410*  --   --   --   --   HEPARINUNFRC 0.47 0.44  --   --    < >  --   --  0.78* 0.65 0.52  CREATININE 2.71* 2.52*  --   --   --  2.28* 2.29*  --   --   --    < > = values in this interval not displayed.    Estimated Creatinine Clearance: 63 mL/min (A) (by C-G formula based on SCr of 2.29 mg/dL (H)).   Assessment: 40 yr old male with history of HTN and CVA presented with acute CVA; S/P tPA on overnight 3/14-3/15. MRI showed acute infarcts with petechial hemorrhage. ECHO on 3/15 showed large hyperechoic mass in left ventricle. Pharmacy was consulted to dose IV heparin using the stroke protocol. Will be conservative with dosing due to bleeding risk.  Pt is S/P EVD placement on 3/20. NSGY ok'd to resume IV heparin with no bolus.  Heparin level slightly supratherapeutic (0.52) on gtt at 1900 units/hr. Neurosurgery noted blood-tinged CSF. No blood on CT yesterday. Neurosurgery okay with continuing heparin for now. No issues with infusion per RN.  Goal of Therapy:  Heparin level 0.3-0.5 units/ml with acute CVA Monitor platelets by anticoagulation protocol: Yes   Plan:  Reduce heparin infusion to 1800 units/hr Check 6 hr heparin level   Sherlon Handing, PharmD, BCPS Please see amion for complete clinical pharmacist phone list 11/02/2020 3:12 AM

## 2020-11-02 NOTE — Plan of Care (Incomplete)
  Temp:  [97.7 F (36.5 C)-99 F (37.2 C)] 97.9 F (36.6 C) (03/22 1200) Pulse Rate:  [81-107] 81 (03/22 0815) Resp:  [0-30] 30 (03/22 0815) BP: (134-178)/(93-150) 160/115 (03/22 0815) SpO2:  [97 %-100 %] 100 % (03/22 1136) FiO2 (%):  [30 %] 30 % (03/22 1136)  General - Well nourished, well developed, intubated off sedation.  Ophthalmologic - fundi not visualized due to noncooperation.  Cardiovascular - Regular rate and rhythm.  Neuro - intubated off sedation, eyes open on stimulation, able to wiggle both toes on commands, however, did not follow other simple commands. With eye opening, eyes in mid position, not consistently blinking to visual threat, doll's eyes present, not tracking, left pupil slightly larger than the right, papillary reflex sluggish. Corneal reflex present, gag and cough present. Breathing over the vent.  Facial symmetry not able to test due to ET tube.  Tongue protrusion not cooperative. On pain stimulation, no movement of BUEs, but mild withdraw of LLE but not RLE. No babinski. Sensation, coordination and gait not tested.

## 2020-11-02 NOTE — Progress Notes (Signed)
Patient ID: Jesus Osborne, male   DOB: [birth year], 40 y.o.   MRN: [id number] BP 129/88   Pulse 96   Temp (!) 101.2 F (38.4 C) (Axillary)   Resp (!) 30   Ht '5\' 10"'$  (1.778 m)   Wt (!) 144.9 kg   SpO2 98%   BMI 45.84 kg/m  Arrived on floor, pupils noted to be dilated and fixed. I replaced ventricular drain, achieved good flow. \ Had attempted to flush drain this afternoon. This was not possible. Currently pupils fixed and dilated, no cough, no gag.  No response to pain Will go to ct

## 2020-11-02 NOTE — Progress Notes (Signed)
NAME:  Jesus Osborne, MRN:  [id number], DOB:  12-Apr-1981, LOS: 7 ADMISSION DATE:  10/19/2020, CONSULTATION DATE:  10/26/20 REFERRING MD:  Lorrin Goodell, CHIEF COMPLAINT:  AMS   Brief History:  Jesus Osborne is a 40 y.o. male who was admitted 3/14 with CVA s/p tPA.  While in [place], had hypoxia requiring NRB then 2L O2 via [place].  Later had change in mental status with minimal response; therefore, intubated before returning to CT.  History of Present Illness:  Pt is encephelopathic; therefore, this HPI is obtained from chart review. Jesus Osborne is a 40 y.o. male who has a PMH including but not limited to prior CVA, HTN, THC and tobacco dependence (see "past medical history" for rest).  He presented to [facility] ED 3/14 with R arm numbness and R sided weakness.  He was taken to CT which was concerning for evolving L MCA infarct.  HE received tPA at 002 (slighly delay due to difficult IV access).  While in CT, he had desaturation requiring NRB and later placed on 2L O2 via [place]. While in ED, later had change in mental status around 0115 with left gaze deviation and decrease in alertness.  Decision made to intubate prior to taking back to CT for repeat scan.  Past Medical History:  has Acute ischemic left MCA stroke ([place]) on their problem list.  Significant Hospital Events:  -3/14 > admit. -3/15 > intubated MRI head 3/15 (06:40)>  1.Acute infarcts in the left left superior cerebellum, artery of [place], and left frontal parietal cortex. 2. Petechial hemorrhage in the left parietal region. 3. Remote hypertensive pattern hemorrhages. -Echo 3/15>>20x62m moderately mobile mass on inferior wall of the LV, increased echogenicity likely representing thrombus but cannot exclude neoplasm, EF 40% -CT head 3/15>>posterior mass effect with partial effacement of 4th ventricle but no obstructive hydrocephalus -EEG 3/15-3/16>> moderate diffuse encephalopathy. No epileptiform discharges -CT head 3/18>>  worsened mass effect almost entirely effacing 4th ventricle with increased ventriculomegaly 15 (01:56)> no acute intracranial hemorrhage - 3/15 started on 3% to mitigate edema - 3/17 family meeting with neurology.  Family aware and realistic regarding possibility for poor neurological recovery.  Plan to observe for recovery over the next 4 days. -3/18 more awake today- 3/19 exam decline overnight with worsening hydrocephalus on CT  -3/20 EVD placed after heparin washout.  -3/21 improvement in exam -3/22    Interim History / Subjective:  EVD output dropped off and became more somnolent.  Objective   Blood pressure (!) 167/150, pulse 83, temperature 98.9 F (37.2 C), temperature source Axillary, resp. rate (!) 30, height '5\' 10"'$  (1.778 m), weight (!) 144.9 kg, SpO2 100 %. CVP:  [4 mmHg-12 mmHg] 9 mmHg  Vent Mode: PRVC FiO2 (%):  [30 %] 30 % Set Rate:  [30 bmp] 30 bmp Vt Set:  [580 mL] 580 mL PEEP:  [5 cmH20] 5 cmH20 Pressure Support:  [8 cmH20] 8 cmH20 Plateau Pressure:  [16 cmH20-23 cmH20] 22 cmH20   Intake/Output Summary (Last 24 hours) at 11/02/2020 [id number] data filed at 11/02/2020 0500 Gross per 24 hour  Intake 2725.86 ml  Output 3365 ml  Net -639.14 ml   Filed Weights   10/30/20 0400 10/31/20 0400 11/01/20 0400  Weight: (!) 143 kg (!) 148.7 kg (!) 144.9 kg    Examination: Constitutional: no acute distress on vent  Eyes: L > R pupil, sluggish, stable Ears, nose, mouth, and throat: ETT in place, minimal secretions Cardiovascular: RRR, ext watm  Respiratory: mechanical breath sounds, no accessory muscle use Gastrointestinal: soft, +BS Skin: No rashes, normal turgor Neurologic: withdraws x 4 but not following commands for me this am Psychiatric: RASS -2 CSF in EVD blood tinged (yesterday am clear)  CBC stable BMP pending Sodium up  Resolved Hospital Problem list   Hyponatremia  Assessment & Plan:    Left Frontal Parietal, Left Superior Cerebellar Infarction s/p  TPA Complicated by hydrocephalus s/p EVD, mild hemorrhagic conversion 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, EF 40%, 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 Critically ill due to acute hypoxic Respiratory Failure AKI, ?if has CKD HTN Diabetes Mellitus Hypernatremia, iatrogenic  Plan:  - EVD drainage per NSGY - Add enteral free water, goal Na 145-155, avoid too rapid of shifts - Increase levemir, continue SSI - Continue secondary stroke prevention - Head CT this AM given increased somnolence and reduced EVD output - Continue Coreg for ischemic heart disease; already on heparin gtt so I guess asa not really needed; defer to neurology - Will need trach/PEG  Best practice:  Diet: TF Pain/Anxiety/Delirium protocol (if indicated): fentanyl, precedex VAP protocol (if indicated): in place DVT prophylaxis: heparin gtt GI prophylaxis: PPI Glucose control: SSI + levemir Mobility: BR Code Status: full Family Communication: per primary Disposition: ICU pending vent liberation  Patient critically ill due to respiratory failure Interventions to address this today ventilator titration Risk of deterioration without these interventions is high  I personally spent 33 minutes providing critical care not including any separately billable procedures  Erskine Emery MD [facility]  Prefer epic messenger for cross cover needs If after hours, please call E-link

## 2020-11-02 NOTE — Progress Notes (Signed)
Called patients sister Azlan Augusto that the EVD drain had become clogged and that the Neurosurgeon could replace it today if that was her wish or could wait until tomorrow after the [place] family meeting. She said she would like the drain to be replaced. Dr. Erlinda Hong will contact Dr. Christella Noa with her decision.   Fatima Sanger MD Resident

## 2020-11-02 NOTE — Progress Notes (Signed)
Patient transported to CT and back without complications. RN at bedside.  

## 2020-11-02 NOTE — Progress Notes (Addendum)
Patient ID: Jesus Osborne, male   DOB: 23-Aug-1980, 40 y.o.   MRN: [id number] Head ct shows greater pressure on brainstem, blood in the third and lateral ventricles. Collapsed left lateral ventricle. Ventricular catheter in the ventricles Initial pressure was greater than 20cm h20. Will drain at 10cmh2o No corneals, pupils now slightly smaller. I do not see them react however.

## 2020-11-02 NOTE — Progress Notes (Signed)
Date and time results received: 11/02/20  0953 (use smartphrase ".now" to insert current time)  Test: bmet  Critical Value: Cl 130 Name of Provider Notified: Ina Homes, MD  Orders Received? Or Actions Taken?: Actions Taken: MD notified

## 2020-11-02 NOTE — Progress Notes (Addendum)
STROKE TEAM PROGRESS NOTE   INTERVAL HISTORY Overnight the output from his EVD decreased. Repeat CT showed possible enlargement of his ventricles.  He continues to have fluctuating mental status. Difficult to arouse and only able to follow some commands. Have talked with patients sister Jesus Osborne, will have family meeting tomorrow 3/23 at 2 PM.  Spoke with Dr. Christella Noa, he will evaluate patients EVD later today and make recommendations. CCM and Neurosurgery are following.  Vitals:   11/02/20 0300 11/02/20 0326 11/02/20 0400 11/02/20 0500  BP: (!) 159/108  (!) 160/113 (!) 167/150  Pulse: 84 85 84 83  Resp: (!) 30 (!) 29 (!) 30 (!) 30  Temp:   98.9 F (37.2 C)   TempSrc:   Axillary   SpO2: 100% 100% 100% 100%  Weight:      Height:       CBC:  Recent Labs  Lab 11/01/20 0540 11/02/20 0613  WBC 9.4 10.4  HGB 10.9* 10.6*  HCT 36.5* 36.2*  MCV 84.7 85.2  PLT 410* 0000000   Basic Metabolic Panel:  Recent Labs  Lab 10/28/20 1543 10/28/20 1745 11/01/20 0540 11/01/20 0842 11/01/20 1312 11/02/20 0100 11/02/20 0613  NA  --    < > 156* 158*   < > 161* 159*  K  --    < > 4.3 4.3  --   --   --   CL  --    < > 126* 127*  --   --   --   CO2  --    < > 23 24  --   --   --   GLUCOSE  --    < > 211* 209*  --   --   --   BUN  --    < > 52* 49*  --   --   --   CREATININE  --    < > 2.28* 2.29*  --   --   --   CALCIUM  --    < > 7.7* 8.1*  --   --   --   MG 2.2  --   --   --   --   --  2.3  PHOS 4.3  --   --   --   --   --  3.8   < > = values in this interval not displayed.   Lipid Panel:  Recent Labs  Lab 10/30/20 0539  TRIG 108   HgbA1c: No results for input(s): HGBA1C in the last 168 hours. Urine Drug Screen:  Recent Labs  Lab 10/26/20 0849  LABOPIA NONE DETECTED  COCAINSCRNUR NONE DETECTED  LABBENZ NONE DETECTED  AMPHETMU NONE DETECTED  THCU POSITIVE*  LABBARB NONE DETECTED    Alcohol Level No results for input(s): ETH in the last 168 hours.  IMAGING past 24  hours DG CHEST PORT 1 VIEW  Result Date: 11/02/2020 CLINICAL DATA:  Fever EXAM: PORTABLE CHEST 1 VIEW COMPARISON:  10/27/2020 FINDINGS: Endotracheal tube is quite high within the upper trachea, 11.4 cm from the carina. A right IJ catheter tip terminates in the mid SVC. A transesophageal tube tip terminates below the margins of imaging, beyond the GE a junction. Side port not well visualized. Diffuse heterogeneous opacities are again seen throughout both lungs with some mild interval clearing in the right left lung bases. Cardiomegaly is similar to comparison exam accounting for differences in technique and inflation. Body wall edema, similar to prior. No acute osseous or soft  tissue abnormality. IMPRESSION: 1. Endotracheal tube is quite high within the upper trachea, 11.4 cm from the carina. Consider advancement 5 cm to the mid trachea. 2. Additional support devices as above. 3. Diffuse heterogeneous opacities throughout both lungs with some mild interval clearing in the right lung bases. Electronically Signed   By: Lovena Le M.D.   On: 11/02/2020 04:59    PHYSICAL EXAM Blood pressure (!) 167/150, pulse 83, temperature 98.9 F (37.2 C), temperature source Axillary, resp. rate (!) 30, height '5\' 10"'$  (1.778 m), weight (!) 144.9 kg, SpO2 100 %.  General: sedated and intubated, obese african [place] male, no apparent distress  Lungs: Symmetrical Chest rise, no labored breathing  Cardio: Regular Rate and Rhythm  Abdomen: Soft, non-tender  Neuro: Sedated and intubated  Able to follow some commands occasionally requiring multiple promts. Cranial Nerves: II:  pupils round Left slightly larger than RIght, non-reactive to light  III,IV, VI: has mild left exotropia VIII: hearing normal bilaterally  Motor: Moves Left LE against gravity. Moves Left hand with prompting. Moves Right LE after multiple prompts. Moves Right fingers minimally with multiple prompts. Tone and bulk:normal tone throughout; no  atrophy noted    ASSESSMENT/PLAN Jesus Osborne is a 40 y.o. male with history of HTN, prior stroke(about 7 years ago per patient) with mild R sided weakness, uses a cane to walk who presents with R arm numbness and worsening of his R sided weakness. He was sitting at his porch eating salad when he had sudden onset R sided weakness, mostly with his arm and also his leg. He is R handed. Writes with his R hand. Uses a cane but can walk around fine. Reports had a stroke 7 years ago with residual mild R sided weakness.Also endorses shortness of breath since yesterday which has gotten worse today.  Is able to move some with multiple prompts. Continues to have waxing and waning mental status. Repeat CT shows some enlargement of the ventricles. Will have family meeting tomorrow at [facility].  Stroke -LeftSuperiorCerebellum,Artery ofPercheron, andLeftFrontal Parietal Cortexinfarctss/p tPA,secondary tocardiogenic emboli left ventricular clot versus mass  CT showed left MCA infarct, old lacunar infarct at left thalamus and left cerebellum.   CTA head and neck negative for LVO but multifocal pneumonia.   MRI showedleft MCA large infarct, bilateral thalamus left MCA small infarcts.  CT head 3/18 showed stable infarct but mild hydrocephalus.  CT head 3/20Acute lateral and 3rd ventriculomegaly with transependymal edemasecondary to loss of basilar cisterns and obstructed 4th ventricle.  CT head 3/22 - Right superior frontal approach EVD position is stable. There is now with a small volume of blood tracking along the catheter, small volume of 3rd and right lateral intraventricular hemorrhage. Mildly enlarged ventricle size since 1527 hours on 10/31/2020 with some transependymal edema, but still improved compared to the pre-drainage CT. 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 See series 3, image 8. Severe posterior fossa  mass effect persists. No significant change in confluent bi thalamic edema, also left parietal lobe cytotoxic edema with petechial hemorrhage.  2D Echo-EF 40%, LV largemass versus clot.   EEG negative for seizure.  [id number]  HgbA1c8.3  VTE prophylaxis -heparin IV  No antithromboticprior to admission, now on IV Heparin   Therapy recommendations:Pending  Disposition:Pending  Obstructive Hydrocephalus  CT head3/20Acute lateral and 3rd ventriculomegaly with transependymal edema secondary to loss of basilar cisterns and obstructed 4th ventricle.  Neurosurgery consulted and EVD placed (3/20)  ICP  goal <20  EVD open at 10  Repeat CT 3/20 showeddecreased ventricular size.  Cytotoxic Edema  CT headLeft cerebellar infarct with abundant cytotoxic edema demonstrates new petechial hemorrhage.  Na Q6h  Goal Na 155-160  EVD placed 3/20  Na 156->158->161>159  3%Salineat 75 cc/hr -> NS @ 25 -> Now Stopped  Hypotension->hypertension  Hypotensive after increased sedation  MAP goal >65  3/21 became hypertensive  OffLevophed now  Resume Coreg  Acute Respiratory Failure  Secondary to stroke  Sedatedon vent  CCM onboard  PossibleRight Lung Pneumoniaon CTA  Unasyn-> Ancef  Tmax 100.7->100.9  Left Ventricle large mass vs Clot  TTE:EF 40%, LV largemass versus clot  HeparinIV per stroke protocol  Consider TEE once stable and after GOC discussion  Hyperlipidemia  Home meds:none  LDL 52, goal < 70  OnCrestor '5mg'$  daily, no high intensity given low level of LDL  Continue statin on discharge  Diabetes type II Uncontrolled  Home meds:None  HgbA1c 8.3, goal < 7.0  CBGs  SSI  Tobacco abuse  Current smoker  Smoking cessation counselingwill beprovided  Other Stroke Risk Factors  Substance abuse - UDS: THC POSITIVE,patientwill beadvised to stop using   Obesity,Body mass index is 48.08 kg/m., BMI >/=  30 associated with increased stroke risk, recommend weight loss, diet and exercise as appropriate   Hx stroke(approximately 2015)  Other Active Problems  CKD stage IV, Cre2.32-2.47-2.71-2.52->2.29   Hospital day # [place] MD Resident  ATTENDING NOTE: I reviewed above note and agree with the assessment and plan. Pt was seen and examined.   Patient still intubated, no significant change of neurological condition.  However, EVD stopped draining, repeat CT showed EVD has clogged and mildly increased ventricular size compared with last CT.  Discussed with NSG Dr. Christella Noa, called patient sister, well removed current EVD and put a new EVD for CSF drainage.  Will hold heparin IV for 3 hours and then proceed with EVD placement. Patient sodium 160, currently on normal saline, will continue.  Afebrile today, will continue Ancef.  On exam, patient intubated off sedation, eyes open on stimulation, able to wiggle both toes on commands, however, did not follow other simple commands. With eye opening, eyes in mid position, not consistently blinking to visual threat, doll's eyes present, not tracking, left pupil slightly larger than the right, papillary reflex sluggish. Corneal reflex present, gag and cough present. Breathing over the vent.  Facial symmetry not able to test due to ET tube.  Tongue protrusion not cooperative. On pain stimulation, no movement of BUEs, but mild withdraw of LLE but not RLE. No babinski. Sensation, coordination and gait not tested.  Called pt sister and would like to have family meeting for [place] discussion.  Tentatively set up at tomorrow 2 PM.  Jesus Hawking, MD PhD Stroke Neurology 11/02/2020 7:42 PM  This patient is critically ill due to LV thrombus, large left cerebellar infarct with cerebral edema and brainstem compression, obstructive hydrocephalus and at significant risk of neurological worsening, death form recurrent stroke, brain herniation, hydrocephalus,  seizure, heart failure. This patient's care requires constant monitoring of vital signs, hemodynamics, respiratory and cardiac monitoring, review of multiple databases, neurological assessment, discussion with family, other specialists and medical decision making of high complexity. I spent 40 minutes of neurocritical care time in the care of this patient. We had long discussion with sister over the phone, updated pt current condition, treatment plan and potential prognosis, and answered all the questions.  Tentatively set up  family meeting for [place] discussion tomorrow 2 PM.      To contact Stroke Continuity provider, please refer to [email or web address] After hours, contact General Neurology

## 2020-11-02 NOTE — Progress Notes (Signed)
Dr Erlinda Hong was paged to notify him of patient's decreasing LOC.  Sternal rub was not illiciting any remarkable response and the EVD had not had any output since 0800.  Dr Erlinda Hong spoke with Dr Christella Noa and he came up to patient's room and determined that the drain was clogged.  Per Dr Lacy Duverney verbal orders, the heparin drip was stopped at @ 1500 and, after 3 hours, he would replace the EVD. Kimya Mccahill C 3:56 PM

## 2020-11-02 NOTE — Progress Notes (Signed)
eLink Physician-Brief Progress Note Patient Name: Jesus Osborne DOB: 01-Sep-1980 MRN: [id number]   Date of Service  11/02/2020  HPI/Events of Note  Notified hypotension 73/46 and decreased mental status. Dilated pupils.  EVD was noted to be clogged this morning with plans to replace tonight. Mental status progressively declining throughout the day. Heparin placed on hold.  Na 159, Cl >130 receiving free water flush 300 cc q 6. UO approximately 200/hr as per chart review.   eICU Interventions  LR bolus 500 cc. Norepinephrine ordered if persistently hypotensive. Get ABG and CMP, may need to increase water flushes.  Neurosurgery now at bedside to replace EVD     Intervention Category Major Interventions: Hypotension - evaluation and management;Change in mental status - evaluation and management  Judd Lien 11/02/2020, 8:24 PM

## 2020-11-02 NOTE — Progress Notes (Signed)
ANTICOAGULATION CONSULT NOTE - Follow Up Consult  Pharmacy Consult for Heparin Indication:  Mobile mass in left ventricle  No Known Allergies  Patient Measurements: Height: '5\' 10"'$  (177.8 cm) Weight: (!) 144.9 kg (319 lb 7.1 oz) IBW/kg (Calculated) : 73 Heparin Dosing Weight: 107.5 kg  Vital Signs: Temp: 97.9 F (36.6 C) (03/22 1200) Temp Source: Axillary (03/22 1200) BP: 150/114 (03/22 1300) Pulse Rate: 78 (03/22 1300)  Labs: Recent Labs    10/31/20 0456 10/31/20 0556 10/31/20 0757 10/31/20 1811 11/01/20 0540 11/01/20 0842 11/01/20 0852 11/01/20 1645 11/02/20 0000 11/02/20 0613 11/02/20 0830 11/02/20 1230  HGB 10.8*   < > 11.9*  --  10.9*  --   --   --   --  10.6*  --   --   HCT 34.7*   < > 35.0*  --  36.5*  --   --   --   --  36.2*  --   --   PLT 441*  --   --   --  410*  --   --   --   --  369  --   --   HEPARINUNFRC 0.44  --   --    < >  --   --    < > 0.65 0.52  --   --  0.54  CREATININE 2.52*  --   --   --  2.28* 2.29*  --   --   --   --  2.22*  --    < > = values in this interval not displayed.    Estimated Creatinine Clearance: 65 mL/min (A) (by C-G formula based on SCr of 2.22 mg/dL (H)).   Assessment: 40 yr old male with history of HTN and CVA presented with acute CVA; S/P tPA on overnight 3/14-3/15. MRI showed acute infarcts with petechial hemorrhage. ECHO on 3/15 showed large hyperechoic mass in left ventricle. Pharmacy was consulted to dose IV heparin using the stroke protocol. Will be conservative with dosing due to bleeding risk.  Pt is S/P EVD placement on 3/20. NSGY ok'd to resume IV heparin with no bolus.  Heparin level remains slightly supratherapeutic (0.54) after rate decrease earlier today. Neurosurgery noted blood-tinged CSF. CT head today showed small volume blood tracking along catheter, small volume 3rd and R lateral IVH, increasing petechial and perhaps early malignant hemorrhagic transformation of L cerebellar infarct. Neurosurgery/Neuro  okay with continuing heparin for now. No issues with infusion per RN.  Goal of Therapy:  Heparin level 0.3-0.5 units/ml with acute CVA Monitor platelets by anticoagulation protocol: Yes   Plan:  Reduce heparin infusion to 1650 units/hr Check 6 hr heparin level Monitor daily CBC, s/sx bleeding   Arturo Morton, PharmD, BCPS Please check AMION for all [place] contact numbers Clinical Pharmacist 11/02/2020 3:10 PM

## 2020-11-02 NOTE — Progress Notes (Signed)
Nutrition Follow-up  DOCUMENTATION CODES:   Morbid obesity  INTERVENTION:   Tube feeding via OG tube:  Glucerna 1.5 at 50 ml/h (1200 ml per day) Prosource TF 90 ml TID  Provides 2040 kcal, 165 gm protein, 912 ml free water daily  300 ml free water every 6 hours Total free water: 2112 ml   NUTRITION DIAGNOSIS:   Inadequate oral intake related to inability to eat as evidenced by NPO status. Ongoing.   GOAL:   Provide needs based on ASPEN/SCCM guidelines Met.   MONITOR:   TF tolerance  REASON FOR ASSESSMENT:   Consult Enteral/tube feeding initiation and management  ASSESSMENT:   Pt with PMH of HTN and CVA admitted with acute multifocal embolic strokes particularly involving the left cerebellum and pons and midbrain  s/p tPA    Pt discussed during ICU rounds and with RN.  CBG's elevated, levemir increased  Per MD will need trach/PEG  3/14 admitted 3/15 intubated; started on 3% 3/18 more awake 3/19 exam decline overnight with worsening hydrocephalus on CT 3/20 EVD placed   Patient is currently intubated on ventilator support MV: 16.4 L/min Temp (24hrs), Avg:98.6 F (37 C), Min:97.7 F (36.5 C), Max:99.2 F (37.3 C)  Medications reviewed and include: SSI, levemir Labs reviewed: Na 159, A1c: 8.3  CBG's: 167-174  OG tube: tip in distal stomach or proximal duodenum  ICP: 140 ml   Current TF:  Vital AF 1.2 at 55 ml/h and Prosource TF 90 ml TID Provides 1824 kcal, 165 gm protein  Diet Order:   Diet Order            Diet NPO time specified  Diet effective now                 EDUCATION NEEDS:   No education needs have been identified at this time  Skin:  Skin Assessment: Reviewed RN Assessment  Last BM:  3/21  Height:   Ht Readings from Last 1 Encounters:  10/26/20 5' 10"  (1.778 m)    Weight:   Wt Readings from Last 1 Encounters:  11/01/20 (!) 144.9 kg    Ideal Body Weight:  75.4 kg  BMI:  Body mass index is 45.84  kg/m.  Estimated Nutritional Needs:   Kcal:  1700-2000  Protein:  151-188 grams  Fluid:  >1.7 L/day  Lockie Pares., RD, LDN, CNSC See AMiON for contact information

## 2020-11-03 ENCOUNTER — Inpatient Hospital Stay (HOSPITAL_COMMUNITY): Payer: Medicaid Other

## 2020-11-03 DIAGNOSIS — I615 Nontraumatic intracerebral hemorrhage, intraventricular: Secondary | ICD-10-CM

## 2020-11-03 DIAGNOSIS — I63532 Cerebral infarction due to unspecified occlusion or stenosis of left posterior cerebral artery: Secondary | ICD-10-CM

## 2020-11-03 DIAGNOSIS — Z978 Presence of other specified devices: Secondary | ICD-10-CM

## 2020-11-03 DIAGNOSIS — G935 Compression of brain: Secondary | ICD-10-CM

## 2020-11-03 LAB — BASIC METABOLIC PANEL
Anion gap: 7 (ref 5–15)
BUN: 66 mg/dL — ABNORMAL HIGH (ref 6–20)
CO2: 24 mmol/L (ref 22–32)
Calcium: 8 mg/dL — ABNORMAL LOW (ref 8.9–10.3)
Chloride: 129 mmol/L — ABNORMAL HIGH (ref 98–111)
Creatinine, Ser: 2.71 mg/dL — ABNORMAL HIGH (ref 0.61–1.24)
GFR, Estimated: 30 mL/min — ABNORMAL LOW (ref >60)
Glucose, Bld: 175 mg/dL — ABNORMAL HIGH (ref 70–99)
Potassium: 4.2 mmol/L (ref 3.5–5.1)
Sodium: 160 mmol/L — ABNORMAL HIGH (ref 135–145)

## 2020-11-03 LAB — URINALYSIS, ROUTINE W REFLEX MICROSCOPIC
Bilirubin Urine: NEGATIVE
Glucose, UA: NEGATIVE mg/dL
Ketones, ur: NEGATIVE mg/dL
Leukocytes,Ua: NEGATIVE
Nitrite: NEGATIVE
Protein, ur: 100 mg/dL — AB
Specific Gravity, Urine: 1.009 (ref 1.005–1.030)
pH: 5 (ref 5.0–8.0)

## 2020-11-03 LAB — LACTIC ACID, PLASMA: Lactic Acid, Venous: 1.7 mmol/L (ref 0.5–1.9)

## 2020-11-03 LAB — CBC
HCT: 45.6 % (ref 39.0–52.0)
Hemoglobin: 13.3 g/dL (ref 13.0–17.0)
MCH: 24.9 pg — ABNORMAL LOW (ref 26.0–34.0)
MCHC: 29.2 g/dL — ABNORMAL LOW (ref 30.0–36.0)
MCV: 85.2 fL (ref 80.0–100.0)
Platelets: 377 10*3/uL (ref 150–400)
RBC: 5.35 MIL/uL (ref 4.22–5.81)
RDW: 19.6 % — ABNORMAL HIGH (ref 11.5–15.5)
WBC: 20.7 10*3/uL — ABNORMAL HIGH (ref 4.0–10.5)
nRBC: 0.4 % — ABNORMAL HIGH (ref 0.0–0.2)

## 2020-11-03 LAB — GLUCOSE, CAPILLARY
Glucose-Capillary: 187 mg/dL — ABNORMAL HIGH (ref 70–99)
Glucose-Capillary: 141 mg/dL — ABNORMAL HIGH (ref 70–99)
Glucose-Capillary: 116 mg/dL — ABNORMAL HIGH (ref 70–99)
Glucose-Capillary: 140 mg/dL — ABNORMAL HIGH (ref 70–99)
Glucose-Capillary: 147 mg/dL — ABNORMAL HIGH (ref 70–99)
Glucose-Capillary: 156 mg/dL — ABNORMAL HIGH (ref 70–99)

## 2020-11-03 LAB — PHOSPHORUS: Phosphorus: 4.7 mg/dL — ABNORMAL HIGH (ref 2.5–4.6)

## 2020-11-03 LAB — SODIUM
Sodium: 158 mmol/L — ABNORMAL HIGH (ref 135–145)
Sodium: 163 mmol/L (ref 135–145)
Sodium: 161 mmol/L (ref 135–145)

## 2020-11-03 LAB — PROCALCITONIN: Procalcitonin: 0.31 ng/mL

## 2020-11-03 LAB — MTHFR DNA ANALYSIS

## 2020-11-03 LAB — MAGNESIUM: Magnesium: 2.6 mg/dL — ABNORMAL HIGH (ref 1.7–2.4)

## 2020-11-03 MED ORDER — FENTANYL CITRATE (PF) 100 MCG/2ML IJ SOLN
50.0000 ug | Freq: Once | INTRAMUSCULAR | Status: AC
  Administered 2020-11-03: 50 ug via INTRAVENOUS

## 2020-11-03 MED ORDER — LACTATED RINGERS IV BOLUS
500.0000 mL | Freq: Once | INTRAVENOUS | Status: AC
  Administered 2020-11-03: 500 mL via INTRAVENOUS

## 2020-11-03 MED ORDER — VANCOMYCIN HCL 1250 MG/250ML IV SOLN
1250.0000 mg | INTRAVENOUS | Status: DC
  Administered 2020-11-04: 1250 mg via INTRAVENOUS
  Filled 2020-11-03: qty 250

## 2020-11-03 MED ORDER — VANCOMYCIN HCL 10 G IV SOLR
2500.0000 mg | Freq: Once | INTRAVENOUS | Status: AC
  Administered 2020-11-03: 2500 mg via INTRAVENOUS
  Filled 2020-11-03: qty 2500

## 2020-11-03 MED ORDER — LORAZEPAM 2 MG/ML IJ SOLN
INTRAMUSCULAR | Status: AC
  Administered 2020-11-03: 2 mg
  Filled 2020-11-03: qty 1

## 2020-11-03 MED ORDER — LORAZEPAM 2 MG/ML IJ SOLN
2.0000 mg | Freq: Once | INTRAMUSCULAR | Status: DC
  Filled 2020-11-03: qty 1

## 2020-11-03 MED ORDER — LORAZEPAM 2 MG/ML IJ SOLN
4.0000 mg | Freq: Once | INTRAMUSCULAR | Status: AC
  Administered 2020-11-03: 2 mg via INTRAVENOUS

## 2020-11-03 MED ORDER — FENTANYL BOLUS VIA INFUSION
50.0000 ug | INTRAVENOUS | Status: DC | PRN
Start: 2020-11-03 — End: 2020-11-05
  Filled 2020-11-03: qty 50

## 2020-11-03 MED ORDER — FENTANYL 2500MCG IN NS 250ML (10MCG/ML) PREMIX INFUSION
50.0000 ug/h | INTRAVENOUS | Status: DC
  Administered 2020-11-03: 50 ug/h via INTRAVENOUS
  Administered 2020-11-04 – 2020-11-05 (×3): 150 ug/h via INTRAVENOUS
  Filled 2020-11-03 (×4): qty 250

## 2020-11-03 MED ORDER — SODIUM CHLORIDE 0.9 % IV SOLN
2.0000 g | Freq: Two times a day (BID) | INTRAVENOUS | Status: DC
  Administered 2020-11-03 – 2020-11-04 (×3): 2 g via INTRAVENOUS
  Filled 2020-11-03 (×3): qty 2

## 2020-11-03 MED ORDER — VANCOMYCIN HCL 1500 MG/300ML IV SOLN: 1500.0000 mg | INTRAVENOUS | Status: DC

## 2020-11-03 MED ORDER — LORAZEPAM 2 MG/ML IJ SOLN
INTRAMUSCULAR | Status: AC
  Administered 2020-11-03: 2 mg
  Filled 2020-11-03: qty 2

## 2020-11-03 NOTE — Progress Notes (Signed)
Patient was noted to have twitching of the eyes and forehead. Dr. Tamala Julian was notified and orders received. I Eli Phillips RN was instructed to call patients sisters. Sisters notified of patients changes. Dr. Erlinda Hong spoke with the sisters and gave them the patients prognosis. Will continue to monitor patient.

## 2020-11-03 NOTE — Progress Notes (Addendum)
STROKE TEAM PROGRESS NOTE   INTERVAL HISTORY Last night had EVD replaced by Neurosurgery and was found to have dilated and fixed pupils with no cough or gag reflex. Repeat CT was obtained which showed - greater pressure on brainstem, blood in the third and lateral ventricles. Collapsed left lateral ventricle  His sisters were at the bedside.  Discussed poor prognosis, sisters understood but wanted to wait to make a decision for 2 more days as the patients brother is undergoing a lung lobe resection and they want him to be more stable before making this decision. Today he has lost most of his interactions - no longer moves to painful stimuli, corneal or gag reflex.  Neurosurgery and CCM are following.  Vitals:   11/03/20 0500 11/03/20 0530 11/03/20 0600 11/03/20 0630  BP: (!) 132/92 (!) 126/93 (!) 125/94 124/86  Pulse: (!) 122 (!) 123 (!) 121 (!) 121  Resp: '16 16 13 12  '$ Temp:      TempSrc:      SpO2: 96% 97% 97% 97%  Weight:      Height:       CBC:  Recent Labs  Lab 11/02/20 0613 11/02/20 2102 11/03/20 0640  WBC 10.4  --  20.7*  HGB 10.6* 11.2* 13.3  HCT 36.2* 33.0* 45.6  MCV 85.2  --  85.2  PLT 369  --  [id number]   Basic Metabolic Panel:  Recent Labs  Lab 11/02/20 0613 11/02/20 0830 11/02/20 2023 11/02/20 2102 11/03/20 0100 11/03/20 0524  NA 159*   < > 155* 166* 158* 160*  K  --    < > 4.3 4.3  --  4.2  CL  --    < > 128*  --   --  129*  CO2  --    < > 22  --   --  24  GLUCOSE  --    < > 170*  --   --  175*  BUN  --    < > 61*  --   --  66*  CREATININE  --    < > 2.44*  --   --  2.71*  CALCIUM  --    < > 8.0*  --   --  8.0*  MG 2.3  --   --   --   --  2.6*  PHOS 3.8  --   --   --   --  4.7*   < > = values in this interval not displayed.   Lipid Panel:  Recent Labs  Lab 10/30/20 0539  TRIG 108   HgbA1c: No results for input(s): HGBA1C in the last 168 hours. Urine Drug Screen: No results for input(s): LABOPIA, COCAINSCRNUR, LABBENZ, AMPHETMU, THCU, LABBARB in the  last 168 hours.  Alcohol Level No results for input(s): ETH in the last 168 hours.  IMAGING past 24 hours CT HEAD WO CONTRAST  Result Date: 11/02/2020 CLINICAL DATA:  Stroke follow-up EXAM: CT HEAD WITHOUT CONTRAST TECHNIQUE: Contiguous axial images were obtained from the base of the skull through the vertex without intravenous contrast. COMPARISON:  11/02/2020 at 8:55 a.m. FINDINGS: Brain: Small amount of blood along the course of the right extraventricular drain persists. The amount of intraventricular blood has increased. Multifocal hemorrhage in the left posterior fossa is unchanged. There is persistent severe mass effects within the posterior fossa with upward transtentorial herniation of the brainstem, unchanged the left lateral ventricle is decompressed, but there is persistent hydrocephalus of the right lateral ventricle.  The tip of the EVD is near the left frontal horn and is likely only draining the left side. Unchanged appearance of left MCA territory infarct. Vascular: Negative Skull: Right frontal burr hole Sinuses/Orbits: Negative Other: None IMPRESSION: 1. Repositioned EVD catheter is only draining the left side. The right lateral ventricle remains dilated while the left is decompressed. 2. Unchanged hemorrhage and severe edema in the left cerebellum resulting in upward transtentorial herniation of the brainstem, unchanged. 3. Otherwise unchanged study. Electronically Signed   By: Ulyses Jarred M.D.   On: 11/02/2020 22:13   CT HEAD WO CONTRAST  Addendum Date: 11/02/2020   ADDENDUM REPORT: 11/02/2020 09:42 ADDENDUM: Study discussed by telephone with Dr. Erlinda Hong on 11/02/2020 at 0922 hours. Electronically Signed   By: Genevie Ann M.D.   On: 11/02/2020 09:42   Result Date: 11/02/2020 CLINICAL DATA:  40 year old male status post left cerebellar infarct with progressive intracranial mass effect and ventriculomegaly 2 days ago, status post ventriculostomy placement that day. EXAM: CT HEAD WITHOUT CONTRAST  TECHNIQUE: Contiguous axial images were obtained from the base of the skull through the vertex without intravenous contrast. COMPARISON:  03/20/20202 head CTs and earlier. FINDINGS: Brain: The right superior frontal approach EVD appears to communicate with the right lateral ventricle and terminates in the suprasellar cistern. There is now small volume of hemorrhage along the course of the catheter (series 3 images 12 in 23). Associated new small volume of intraventricular hemorrhage in the 3rd ventricle and at the right foramen of Monro. Ventricle size has mildly enlarged from the initial post placement CT (temporal horn series 3, image 12) although remains smaller from the preprocedural exam. A degree of transependymal edema persists. Trace gas in the right frontal horn has decreased. Increased petechial hemorrhage associated with the left cerebellar infarct, now with a 2 cm area of more macroscopic hemorrhage along the posteroinferior margin on series 3, image 8 which is new. Severe posterior fossa mass effect persists, with effaced 4th ventricle and pre brainstem cisterns. Partially effaced cisterna magna with no tonsillar herniation. Suprasellar cistern remains within normal limits. Confluent thalamic edema left greater than right has not significantly changed. Brainstem gray-white matter differentiation is stable. Left parietal lobe cytotoxic edema and petechial hemorrhage remains stable. Vascular: Mild Calcified atherosclerosis at the skull base. No suspicious intracranial vascular hyperdensity. Skull: Vertex burr hole.  No new osseous abnormality. Sinuses/Orbits: Visualized paranasal sinuses and mastoids are stable and well pneumatized. Other: Stable orbit and scalp soft tissues, postoperative changes at the vertex from EVD. Stable Disconjugate gaze, otherwise negative orbits. Retained secretions in the pharynx, intubated on the scout view. IMPRESSION: 1. Right superior frontal approach EVD position is  stable. There is now with a small volume of blood tracking along the catheter, small volume of 3rd and right lateral intraventricular hemorrhage. 2. Mildly enlarged ventricle size since 1527 hours on 10/31/2020 with some transependymal edema, but still improved compared to the pre-drainage CT. 3. 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 See series 3, image 8. Severe posterior fossa mass effect persists. 4. No significant change in confluent bi thalamic edema, also left parietal lobe cytotoxic edema with petechial hemorrhage. Electronically Signed: By: Genevie Ann M.D. On: 11/02/2020 09:15   DG Chest Port 1 View  Result Date: 11/03/2020 CLINICAL DATA:  ETT placement EXAM: PORTABLE CHEST 1 VIEW COMPARISON:  Radiograph 11/02/2020 FINDINGS: Endotracheal tube tip remains quite high in the trachea, 12.7 cm from the carina. Transesophageal tube tip  and side port terminate below the margins of imaging, beyond the GE junction. Right IJ catheter tip terminates in the mid SVC. Telemetry leads overlie the chest. Persistent heterogeneous bilateral airspace opacities slightly increasing from most recent radiography in the right lung periphery, elsewhere stable. No pneumothorax. No visible layering effusion or pneumothorax though portion of the left costophrenic sulcus is collimated from view. Stable cardiomegaly of counting for differences in technique. No acute osseous or soft tissue abnormality. IMPRESSION: 1. Endotracheal tube tip remains quite high in the trachea, 12.7 cm from the carina. Consider advancing 6-7 cm. 2. Persistent heterogeneous bilateral airspace opacities, slightly increasing in the right lung periphery, elsewhere stable. These results will be called to the ordering clinician or representative by the Radiologist Assistant, and communication documented in the PACS or [employer]. Electronically Signed   By: Lovena Le M.D.   On:  11/03/2020 01:37    PHYSICAL EXAM Blood pressure 124/86, pulse (!) 121, temperature (!) 101.5 F (38.6 C), temperature source Axillary, resp. rate 12, height '5\' 10"'$  (1.778 m), weight (!) 144.9 kg, SpO2 97 %.  General: intubated, obese african [place] male, no apparent distress  Lungs: Symmetrical Chest rise, no labored breathing  Cardio: Regular Rate and Rhythm  Abdomen: Soft, non-tender  Neuro: Intubated, not responding to painful stimuli. Does not have gag, cough, or Corneal Reflex.Is breathing above ventilator.  No movement to painful stimuli.    ASSESSMENT/PLAN Jesus Osborne is a 40 y.o. male with history of HTN, prior stroke(about 7 years ago per patient) with mild R sided weakness, uses a cane to walk who presents with R arm numbness and worsening of his R sided weakness. He was sitting at his porch eating salad when he had sudden onset R sided weakness, mostly with his arm and also his leg. He is R handed. Writes with his R hand. Uses a cane but can walk around fine. Reports had a stroke 7 years ago with residual mild R sided weakness.Also endorses shortness of breath since yesterday which has gotten worse today.  Since yesterday he has lost most of his brain function. He is still breathing above the ventilator but has no reflex's and no responses. Will give the family 2 days to talk with the brother, however, it does appear moving to comfort care will be soon.  Stroke -LeftSuperiorCerebellum,Artery ofPercheron, andLeftFrontal Parietal Cortexinfarctss/p tPA,secondary tocardiogenic emboli left ventricular clot versus mass  CT showed left MCA infarct, old lacunar infarct at left thalamus and left cerebellum.   CTA head and neck negative for LVO but multifocal pneumonia.   MRI showedleft MCA large infarct, bilateral thalamus left MCA small infarcts.  CT head 3/18 showed stable infarct but mild hydrocephalus.  CT head 3/20Acute lateral and 3rd  ventriculomegaly with transependymal edemasecondary to loss of basilar cisterns and obstructed 4th ventricle.  CT head 3/22 945 - Right superior frontal approach EVD position is stable. There is now with a small volume of blood tracking along the catheter, small volume of 3rd and right lateral intraventricular hemorrhage. Mildly enlarged ventricle size since 1527 hours on 10/31/2020 with some transependymal edema, but still improved compared to the pre-drainage CT. 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 See series 3, image 8. Severe posterior fossa mass effect persists. No significant change in confluent bi thalamic edema, also left parietal lobe cytotoxic edema with petechial hemorrhage.  CT Head 3/22 2215 - Repositioned EVD catheter is only draining the left side.  The right lateral ventricle remains dilated while the left is decompressed. Unchanged hemorrhage and severe edema in the left cerebellum resulting in upward transtentorial herniation of the brainstem, unchanged. Otherwise unchanged study.  2D Echo-EF 40%, LV largemass versus clot.   EEG negative for seizure.  [id number]  HgbA1c8.3  VTE prophylaxis -heparin IV  No antithromboticprior to admission, was on IV Heparin, now d/c due to hemorrhagic conversion and new IVH  Disposition:pt neuro decline 3/23, discussed with two sisters and they are leaning towards comfort care but would like to hold off for 2 days until pt brother completed his surgery and able to express his opinion  Obstructive Hydrocephalus  CT head3/20Acute lateral and 3rd ventriculomegaly with transependymal edema secondary to loss of basilar cisterns and obstructed 4th ventricle.  Neurosurgery consulted and EVD placed (3/20)  ICP goal <20  EVD open at 10  Repeat CT 3/20 showeddecreased ventricular size.  Repeat CT 3/22 showed increased ventricular size but still smaller than  prior to EVD  Old EVD removal and new EVD placed (3/22)  Repeat CT 3/22 showed new IVH - new EVD again clogged  Hemorrhagic conversion - left cerebellum  IVH  CT 3/22 showed left cerebellum small hemorrhagic conversion and new IVH s/p new EVD  Heparin IV discontinued.  Cytotoxic Edema  CT headLeft cerebellar infarct with abundant cytotoxic edema demonstrates new petechial hemorrhage.  Na Q6h  Goal Na 155-160  EVD placed 3/20  Na 156->158->161>159->163  3%Salineat75 cc/hr-> NS @ 25 -> free water 300 Q6  Hypotension->hypertension->hypotension  Hypotensive after increased sedation  MAP goal >65  3/21 became hypertensive  3/23 became hypotensive again  Back onLevophedagain  Off Coreg  Acute Respiratory Failure  Secondary to stroke  Sedatedon vent  CCM onboard  PossibleRight Lung Pneumoniaon CTA  Unasyn-> Ancef  Tmax 100.7->100.9->101.5  Left Ventricle large mass vs Clot Tachycardia   TTE:EF 40%, LV largemass versus clot  HeparinIV per stroke protocol  Tachycardia HR 120s  Hyperlipidemia  Home meds:none  LDL 52, goal < 70  OnCrestor '5mg'$  daily, no high intensity given low level of LDL  Diabetes type II Uncontrolled  Home meds:None  HgbA1c 8.3, goal < 7.0  CBGs  SSI  Tobacco abuse  Current smoker  Smoking cessation counselingwill beprovided  Other Stroke Risk Factors  Substance abuse - UDS: THC POSITIVE,patientwill beadvised to stop using   Obesity,Body mass index is 48.08 kg/m., BMI >/= 30 associated with increased stroke risk, recommend weight loss, diet and exercise as appropriate   Hx stroke(approximately 2015)  Other Active Problems  CKD stage IV, Cre2.32-2.47-2.71-2.52->2.29->2.71   Hospital day # [place] MD Resident  ATTENDING NOTE: I reviewed above note and agree with the assessment and plan. Pt was seen and examined.   Both sisters are at bedside.  Patient  condition declined overnight, neurologically much worse, this morning, eyes closed, not responding, not following commands, pupil left 4 mm, right 3 mm, nonreactive to light, no corneal reflexes, no gag and cough, still breathing over the vent.  Not moving extremities on pain stimulation.  CT head yesterday showed EVD clogged with blood clot in the EVD with re-developing hydrocephalus, discussed with Dr. Christella Noa neurosurgery, EVD, placed new EVD.  Repeat CT head again showed EVD clogged but new IVH.  With current neuro decline with lost of all cranial nerve reflexes, do not feel further replacement of EVD will make any change of outcome.  I had long discussion with both sisters at bedside, updated pt  current condition,  treatment plan and poor prognosis, reviewed neuro images, and answered all the questions. They expressed understanding.  They feel that patient would not want to live in the situation without quality of her life.  They are leaning towards to comfort care measures, however, they wanted patient brother also expressed his opinion.  However patient brother currently in [place] undergoing surgery.  Sister would like to hold off making the decision for comfort care for 2 days and they will let us know.  For detailed assessment and plan, please refer to above as I have made changes wherever appropriate.   Rosalin Hawking, MD PhD Stroke Neurology 11/03/2020 7:25 PM  This patient is critically ill due to LV thrombus, large left cerebellar infarct, obstructive hydrocephalus, cerebral edema, hemorrhagic conversion, IVH and at significant risk of neurological worsening, death form brain herniation, cerebral edema, hydrocephalus, brain bleed. This patient's care requires constant monitoring of vital signs, hemodynamics, respiratory and cardiac monitoring, review of multiple databases, neurological assessment, discussion with family, other specialists and medical decision making of high complexity. I  spent 50 minutes of neurocritical care time in the care of this patient.   To contact Stroke Continuity provider, please refer to [email or web address] After hours, contact General Neurology

## 2020-11-03 NOTE — Progress Notes (Signed)
NAME:  TERREANCE VALCOURT, MRN:  [id number], DOB:  [birth year], LOS: 8 ADMISSION DATE:  10/29/2020, CONSULTATION DATE:  10/26/20 REFERRING MD:  Lorrin Goodell, CHIEF COMPLAINT:  AMS   Brief History:  BEZALEL SENN is a 40 y.o. male who was admitted 3/14 with CVA s/p tPA.  While in [place], had hypoxia requiring NRB then 2L O2 via [place].  Later had change in mental status with minimal response; therefore, intubated before returning to CT.  History of Present Illness:  Pt is encephelopathic; therefore, this HPI is obtained from chart review. STELLAN FERMAN is a 40 y.o. male who has a PMH including but not limited to prior CVA, HTN, THC and tobacco dependence (see "past medical history" for rest).  He presented to [facility] ED 3/14 with R arm numbness and R sided weakness.  He was taken to CT which was concerning for evolving L MCA infarct.  HE received tPA at 002 (slighly delay due to difficult IV access).  While in CT, he had desaturation requiring NRB and later placed on 2L O2 via [place]. While in ED, later had change in mental status around 0115 with left gaze deviation and decrease in alertness.  Decision made to intubate prior to taking back to CT for repeat scan.  Past Medical History:  has Acute ischemic left MCA stroke ([place]) on their problem list.  Significant Hospital Events:  -3/14 > admit. -3/15 > intubated MRI head 3/15 (06:40)>  1.Acute infarcts in the left left superior cerebellum, artery of [place]on territory, and left frontal parietal cortex. 2. Petechial hemorrhage in the left parietal region. 3. Remote hypertensive pattern hemorrhages. -Echo 3/15>>20x69m moderately mobile mass on inferior wall of the LV, increased echogenicity likely representing thrombus but cannot exclude neoplasm, EF 40% -CT head 3/15>>posterior mass effect with partial effacement of 4th ventricle but no obstructive hydrocephalus -EEG 3/15-3/16>> moderate diffuse encephalopathy. No epileptiform discharges -CT head 3/18>>  worsened mass effect almost entirely effacing 4th ventricle with increased ventriculomegaly 15 (01:56)> no acute intracranial hemorrhage - 3/15 started on 3% to mitigate edema - 3/17 family meeting with neurology.  Family aware and realistic regarding possibility for poor neurological recovery.  Plan to observe for recovery over the next 4 days. -3/18 more awake today- 3/19 exam decline overnight with worsening hydrocephalus on CT  -3/20 EVD placed after heparin washout.  -3/21 improvement in exam -3/22 worsening exam, EVD clogged, replaced, worsening edema and hemorrhage on CT   Interim History / Subjective:  Little EVD output. Became febrile and hypotensive overnight. Abx broadened, pan-cultured, started on pressors.  Objective   Blood pressure 124/86, pulse (!) 121, temperature (!) 101.5 F (38.6 C), temperature source Axillary, resp. rate 12, height '5\' 10"'$  (1.778 m), weight (!) 144.9 kg, SpO2 97 %. CVP:  [5 mmHg-22 mmHg] 12 mmHg  Vent Mode: PRVC FiO2 (%):  [30 %-50 %] 50 % Set Rate:  [30 bmp] 30 bmp Vt Set:  [580 mL] 580 mL PEEP:  [5 cmH20] 5 cmH20 Plateau Pressure:  [22 cmH20-30 cmH20] 23 cmH20   Intake/Output Summary (Last 24 hours) at 11/03/2020 0730 Last data filed at 11/03/2020 0600 Gross per 24 hour  Intake 4181.58 ml  Output 2928 ml  Net 1253.58 ml   Filed Weights   10/30/20 0400 10/31/20 0400 11/01/20 0400  Weight: (!) 143 kg (!) 148.7 kg (!) 144.9 kg    Examination: Constitutional: ill appearing man on vent  Eyes: pupils now fixed and dilated, L>R Ears, nose, mouth, and  throat: ETT in place, minimal secretions Cardiovascular: tachycardic, ext warm Respiratory: scattered rhonci, dys-synchronous with vent Gastrointestinal: soft, +BS Skin: No rashes, normal turgor Neurologic:  GCS3 for me Psychiatric: cannot assess  CT head reviewed, worsening herniation, edema and bleeding from left cerebellar infarct  Worsening renal function Worsening liver  function  Resolved Hospital Problem list   Hyponatremia  Assessment & Plan:    -Left Frontal Parietal, Left Superior Cerebellar Infarction s/p TPA Complicated by hydrocephalus s/p EVD, mild hemorrhagic conversion -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  Would get CSF cultures as well if NSGY agrees. - Given worsening exam and head CT appearance I am not sure this is a survivable insult - Plans for [place] meeting today - Titrate levophed to 65; dc antihypertensives - Add fentanyl drip for vent synchrony and comfort - Remainder of plans per primary, happy to help with any family discussions  Best practice:  Diet: TF Pain/Anxiety/Delirium protocol (if indicated): fentanyl VAP protocol (if indicated): in place DVT prophylaxis: SCDs given worsening hemorrhagic transformation GI prophylaxis: PPI Glucose control: SSI + levemir Mobility: BR Code Status: full Family Communication: per primary Disposition: ICU pending vent liberation  Patient critically ill due to respiratory failure, shock Interventions to address this today ventilator titration Risk of deterioration without these interventions is high  I personally spent 45 minutes providing critical care not including any separately billable procedures  Erskine Emery MD [facility]  Prefer epic messenger for cross cover needs If after hours, please call  E-link

## 2020-11-03 NOTE — Procedures (Signed)
EEG Report Indication: possible seizure activity--unresponsive; right parietal drain  This study was recorded in the comatose/not medically induced state.  The duration of the study was 24 minutes.  Electrodes were placed according to the International 10/20 system.  Video was reviewed available for clinical correlation as needed.  The background is nearly isoelectric, with frequencies mostly below 10 V, likely less than 5 V, although there is possibly some degree of very low amplitude cortical electrical activity in the interior regions, slightly greater in the right versus the left hemisphere. This is comprised exclusively of <0.5-1 Hz delta, well under 20 microvolts, and could potentially represent artifact vs true physiologic signal of cortical origin.  There was no reactivity to stimulation and minimal variability.  On video, subtle right eyelid twitching is noted without clear electrographic correlation.  Hyperventilation: deferred Photic stimulation: deferred  Impression:  This was an abnormal study due to a very suppressed/low-voltage backgrounds, being nearly isoelectric ("flat line")--there may have been some very low amplitude R>L frontal region electrical activity of cortical origin vs artifact--on the whole, the latter is favored.  Right eyelid twitching (subtle) was seen without corresponding electrographic correlation, and no epileptiform abnormalities were seen at any point.  The above findings are most consistent with a severe diffuse encephalopathy. This pattern is seen in the context of hypoxic injury without clear confounders (metabolic, temperature, sedation) it is typically highly specific for a very poor prognosis for recovery of consciousness, a finding which seems to hold prognostic value regardless of use of hypothermia protocol.

## 2020-11-03 NOTE — Progress Notes (Addendum)
Dr. Christella Noa was notified that the EVD is not draining. Dr. Christella Noa stated there would be no intervention at this time. Will continue to monitor.   1700 Dr. Christella Noa spoke with family about EVD. Family stated they do not want to escalate care at this time. Will continue to monitor.

## 2020-11-03 NOTE — Progress Notes (Signed)
Pharmacy Antibiotic Note  Jesus Osborne is a 40 y.o. male admitted on 11/11/2020 with sepsis.  Pharmacy has been consulted for Vancomycin dosing. Pt also on Cefazolin s/p EVD placement and now with fevers.   Plan: Vancomycin '2500mg'$  IV now then 1500 mg IV Q 24 hrs. Goal AUC 400-550. Expected AUC: 523 SCr used: 2.44 Will f/u renal function, micro data, and pt's clinical condition Vanc levels prn   Height: '5\' 10"'$  (177.8 cm) Weight: (!) 144.9 kg (319 lb 7.1 oz) IBW/kg (Calculated) : 73  Temp (24hrs), Avg:99 F (37.2 C), Min:97.7 F (36.5 C), Max:101.2 F (38.4 C)  Recent Labs  Lab 10/29/20 0305 10/30/20 0539 10/31/20 0456 11/01/20 0540 11/01/20 0842 11/02/20 0613 11/02/20 0830 11/02/20 2023  WBC 14.3* 13.1* 10.1 9.4  --  10.4  --   --   CREATININE 2.47* 2.71* 2.52* 2.28* 2.29*  --  2.22* 2.44*    Estimated Creatinine Clearance: 59.1 mL/min (A) (by C-G formula based on SCr of 2.44 mg/dL (H)).    No Known Allergies  Antimicrobials this admission: Unasyn 3/15 >> 3/19 Cefazolin 3/20 >> Vancomycin 3/23 >>  Microbiology results: 3/15 TA - negative 3/15 MRSA PCR - negative  Thank you for allowing pharmacy to be a part of this patient's care.  Sherlon Handing, PharmD, BCPS Please see amion for complete clinical pharmacist phone list 11/03/2020 1:48 AM

## 2020-11-03 NOTE — Progress Notes (Signed)
Patients EEG is complete. EVD was balanced and opened to drain. Drain put out and then stopped. Line was assessed and noted to be blocked. Dr. Christella Noa was notified and drain was flushed and re-opened to drain. Will continue to monitor.

## 2020-11-03 NOTE — Progress Notes (Signed)
Date and time results received: 11/03/20 1955  Test: Na Critical Value: 161  Name of Provider Notified: Rory Percy  Orders Received: Get peripheral lab draw  Hart Rochester, RN

## 2020-11-03 NOTE — Progress Notes (Signed)
EEG completed, results pending. 

## 2020-11-03 NOTE — Progress Notes (Signed)
Pharmacy Antibiotic Note  Jesus Osborne is a 40 y.o. male admitted on 10/30/2020 with sepsis.  Pharmacy has been consulted for cefepime/vancomycin dosing. Patient previously on cefazolin s/p EVD placement - now with continued fevers. SCr trend up to 2.71 today.  Plan: Cefepime 2g IV q12h Adjust vancomycin to '1250mg'$  IV q24h. Goal AUC 400-550. Expected AUC: 478 SCr used: 2.71 Monitor clinical progress, c/s, renal function F/u de-escalation plan/LOT, vancomycin levels as indicated   Height: '5\' 10"'$  (177.8 cm) Weight: (!) 144.9 kg (319 lb 7.1 oz) IBW/kg (Calculated) : 73  Temp (24hrs), Avg:99.4 F (37.4 C), Min:97.7 F (36.5 C), Max:101.5 F (38.6 C)  Recent Labs  Lab 10/30/20 0539 10/31/20 0456 11/01/20 0540 11/01/20 0842 11/02/20 0613 11/02/20 0830 11/02/20 2023 11/03/20 0205 11/03/20 0524 11/03/20 0640  WBC 13.1* 10.1 9.4  --  10.4  --   --   --   --  20.7*  CREATININE 2.71* 2.52* 2.28* 2.29*  --  2.22* 2.44*  --  2.71*  --   LATICACIDVEN  --   --   --   --   --   --   --  1.7  --   --     Estimated Creatinine Clearance: 53.2 mL/min (A) (by C-G formula based on SCr of 2.71 mg/dL (H)).    No Known Allergies   Arturo Morton, PharmD, BCPS Please check AMION for all [place] contact numbers Clinical Pharmacist 11/03/2020 8:16 AM

## 2020-11-03 NOTE — Progress Notes (Signed)
eLink Physician-Brief Progress Note Patient Name: Jesus Osborne DOB: 07/25/1981 MRN: [id number]   Date of Service  11/03/2020  HPI/Events of Note  Notified of tachycardia 120s Noted vent desynchrony. CXR this am reviewed with ETT high lying (11 cm above the carina). Unclear if this has been repositioned. Decreased UO Had episode of fever earlier 101.2 given Tylenol with resolution CVP 11-13 on positive pressure ventilation. BMI 46  eICU Interventions  Ordered another 500 CC LR bolus Currently febrile at 104 hence tachycardic  Ordered stat CXR for tube positioning as well as as investigate for possible sources of infection Ordered UA, blood culture and culture from EVD as well  Added vancomycin, already on cefazolin CBC, lactic acid and procal as fever may be central in etiology as well     Intervention Category Major Interventions: Arrhythmia - evaluation and management  Shona Needles Doretha Goding 11/03/2020, 1:21 AM

## 2020-11-04 LAB — CBC
HCT: 39.1 % (ref 39.0–52.0)
Hemoglobin: 11.5 g/dL — ABNORMAL LOW (ref 13.0–17.0)
MCH: 25.4 pg — ABNORMAL LOW (ref 26.0–34.0)
MCHC: 29.4 g/dL — ABNORMAL LOW (ref 30.0–36.0)
MCV: 86.3 fL (ref 80.0–100.0)
Platelets: 229 10*3/uL (ref 150–400)
RBC: 4.53 MIL/uL (ref 4.22–5.81)
RDW: 19.4 % — ABNORMAL HIGH (ref 11.5–15.5)
WBC: 21.4 10*3/uL — ABNORMAL HIGH (ref 4.0–10.5)
nRBC: 0.1 % (ref 0.0–0.2)

## 2020-11-04 LAB — SODIUM
Sodium: 161 mmol/L (ref 135–145)
Sodium: 160 mmol/L — ABNORMAL HIGH (ref 135–145)
Sodium: 155 mmol/L — ABNORMAL HIGH (ref 135–145)

## 2020-11-04 LAB — GLUCOSE, CAPILLARY
Glucose-Capillary: 159 mg/dL — ABNORMAL HIGH (ref 70–99)
Glucose-Capillary: 164 mg/dL — ABNORMAL HIGH (ref 70–99)
Glucose-Capillary: 154 mg/dL — ABNORMAL HIGH (ref 70–99)
Glucose-Capillary: 160 mg/dL — ABNORMAL HIGH (ref 70–99)
Glucose-Capillary: 152 mg/dL — ABNORMAL HIGH (ref 70–99)
Glucose-Capillary: 154 mg/dL — ABNORMAL HIGH (ref 70–99)

## 2020-11-04 LAB — MAGNESIUM: Magnesium: 2.4 mg/dL (ref 1.7–2.4)

## 2020-11-04 LAB — PHOSPHORUS: Phosphorus: 5.6 mg/dL — ABNORMAL HIGH (ref 2.5–4.6)

## 2020-11-04 LAB — PROCALCITONIN: Procalcitonin: 0.58 ng/mL

## 2020-11-04 MED ORDER — SODIUM CHLORIDE 0.9 % IV SOLN
1.0000 g | INTRAVENOUS | Status: DC
  Administered 2020-11-05: 1 g via INTRAVENOUS
  Filled 2020-11-04 (×2): qty 1

## 2020-11-04 MED ORDER — VANCOMYCIN VARIABLE DOSE PER UNSTABLE RENAL FUNCTION (PHARMACIST DOSING): Status: DC

## 2020-11-04 MED ORDER — FREE WATER
350.0000 mL | Freq: Four times a day (QID) | Status: DC
  Administered 2020-11-04 – 2020-11-05 (×6): 350 mL

## 2020-11-04 NOTE — Progress Notes (Signed)
Pharmacy Antibiotic Note  Jesus Osborne is a 40 y.o. male admitted on 10/24/2020 with sepsis.  Pharmacy has been consulted for cefepime/vancomycin dosing. Patient previously on cefazolin s/p EVD placement - now with continued fevers. SCr trend up from 2.71 to 5.13.  Plan: Decrease Cefepime 1g IV q24h Change vanc to variable dosing - will redose based on levels Monitor clinical progress, c/s, renal function F/u de-escalation plan/LOT, vancomycin levels as indicated   Height: '5\' 10"'$  (177.8 cm) Weight: (!) 144.9 kg (319 lb 7.1 oz) IBW/kg (Calculated) : 73  Temp (24hrs), Avg:100.5 F (38.1 C), Min:98.7 F (37.1 C), Max:101.9 F (38.8 C)  Recent Labs  Lab 10/31/20 0456 11/01/20 0540 11/01/20 0842 11/02/20 0613 11/02/20 0830 11/02/20 2023 11/03/20 0205 11/03/20 0524 11/03/20 0640 11/04/20 0532  WBC 10.1 9.4  --  10.4  --   --   --   --  20.7* 21.4*  CREATININE 2.52* 2.28* 2.29*  --  2.22* 2.44*  --  2.71*  --  5.13*  LATICACIDVEN  --   --   --   --   --   --  1.7  --   --   --     Estimated Creatinine Clearance: 28.1 mL/min (A) (by C-G formula based on SCr of 5.13 mg/dL (H)).    No Known Allergies  Alanda Slim, PharmD, [employer] [facility] Clinical Pharmacist Please see AMION for all Pharmacists' Contact Phone Numbers 11/04/2020, 10:45 AM

## 2020-11-04 NOTE — Progress Notes (Signed)
NAME:  Jesus Osborne, MRN:  [id number], DOB:  [birth year], LOS: 9 ADMISSION DATE:  10/12/2020, CONSULTATION DATE:  10/26/20 REFERRING MD:  Lorrin Goodell, CHIEF COMPLAINT:  AMS   Brief History:  Jesus Osborne is a 40 y.o. male who was admitted 3/14 with CVA s/p tPA.  While in [place], had hypoxia requiring NRB then 2L O2 via [place].  Later had change in mental status with minimal response; therefore, intubated before returning to CT.  History of Present Illness:  Pt is encephelopathic; therefore, this HPI is obtained from chart review. Jesus Osborne is a 40 y.o. male who has a PMH including but not limited to prior CVA, HTN, THC and tobacco dependence (see "past medical history" for rest).  He presented to [facility] ED 3/14 with R arm numbness and R sided weakness.  He was taken to CT which was concerning for evolving L MCA infarct.  HE received tPA at 002 (slighly delay due to difficult IV access).  While in CT, he had desaturation requiring NRB and later placed on 2L O2 via [place]. While in ED, later had change in mental status around 0115 with left gaze deviation and decrease in alertness.  Decision made to intubate prior to taking back to CT for repeat scan.  Past Medical History:  has Acute ischemic left MCA stroke ([place]) on their problem list.  Significant Hospital Events:  -3/14 > admit. -3/15 > intubated MRI head 3/15 (06:40)>  1.Acute infarcts in the left left superior cerebellum, artery of per[place] territory, and left frontal parietal cortex. 2. Petechial hemorrhage in the left parietal region. 3. Remote hypertensive pattern hemorrhages. -Echo 3/15>>20x90m moderately mobile mass on inferior wall of the LV, increased echogenicity likely representing thrombus but cannot exclude neoplasm, EF 40% -CT head 3/15>>posterior mass effect with partial effacement of 4th ventricle but no obstructive hydrocephalus -EEG 3/15-3/16>> moderate diffuse encephalopathy. No epileptiform discharges -CT head 3/18>>  worsened mass effect almost entirely effacing 4th ventricle with increased ventriculomegaly 15 (01:56)> no acute intracranial hemorrhage - 3/15 started on 3% to mitigate edema - 3/17 family meeting with neurology.  Family aware and realistic regarding possibility for poor neurological recovery.  Plan to observe for recovery over the next 4 days. -3/18 more awake today- 3/19 exam decline overnight with worsening hydrocephalus on CT  -3/20 EVD placed after heparin washout.  -3/21 improvement in exam -3/22 worsening exam, EVD clogged, replaced, worsening edema and hemorrhage on CT   Interim History / Subjective:  EVD not replaced after discussion with family. Comatose on vent.  Objective   Blood pressure 108/75, pulse 93, temperature (!) 101.3 F (38.5 C), temperature source Axillary, resp. rate (!) 30, height '5\' 10"'$  (1.778 m), weight (!) 144.9 kg, SpO2 95 %. CVP:  [5 mmHg-19 mmHg] 16 mmHg  Vent Mode: PRVC FiO2 (%):  [40 %-50 %] 40 % Set Rate:  [30 bmp] 30 bmp Vt Set:  [580 mL] 580 mL PEEP:  [5 cmH20] 5 cmH20 Plateau Pressure:  [20 cmH20-28 cmH20] 23 cmH20   Intake/Output Summary (Last 24 hours) at 11/04/2020 0725 Last data filed at 11/04/2020 0600 Gross per 24 hour  Intake 3501.12 ml  Output 1362 ml  Net 2139.12 ml   Filed Weights   10/30/20 0400 10/31/20 0400 11/01/20 0400  Weight: (!) 143 kg (!) 148.7 kg (!) 144.9 kg    Examination: Constitutional: ill appearing man on vent  Eyes: R and L pupils are now fixed and dilated (yesterday asymetric L>R) Ears, nose,  mouth, and throat: ETT in place, minimal secretions Cardiovascular: tachycardic, ext warm Respiratory: scattered rhonci, now synchronous with vent Gastrointestinal: soft, +BS Skin: No rashes, normal turgor Neurologic:  GCS3 Triggers vent Psychiatric: cannot assess  Renal failure worsening WBC worsening CBG okay  Resolved Hospital Problem list   Hyponatremia  Assessment & Plan:    - Malignant L cerebellar  stroke with hemorrhagic transformation, herniation, obstructive hydrocephalus, nonsurvivable - ARDS - Worsening multiorgan failure due to herniation and sepsis - Septic and neurogenic shock - Approaching end of life - Iatrogenic severe hypernatremia  Plan: - Continue broad spectrum antibiotics, vent support, pressors - f/u culture data - No further procedures, not HD candidate - Continue family discussions, I am happy to help with this   Best practice:  Diet: TF Pain/Anxiety/Delirium protocol (if indicated): fentanyl VAP protocol (if indicated): in place DVT prophylaxis: SCDs given worsening hemorrhagic transformation GI prophylaxis: PPI Glucose control: SSI + levemir Mobility: BR Code Status: full Family Communication: per primary Disposition: ICU  Patient critically ill due to respiratory failure, shock Interventions to address this today ventilator titration Risk of deterioration without these interventions is high  I personally spent 35 minutes providing critical care not including any separately billable procedures  Erskine Emery MD [facility]  Prefer epic messenger for cross cover needs If after hours, please call E-link

## 2020-11-04 NOTE — Progress Notes (Signed)
[place] was notified and screening process started. Reference number is [id number].

## 2020-11-04 NOTE — Progress Notes (Signed)
SLP Cancellation Note  Patient Details Name: Jesus Osborne MRN: [id number] DOB: July 05, 1981   Cancelled treatment:       Reason Eval/Treat Not Completed: Medical issues which prohibited therapy. Given poor prognosis will sign off at this time.    Miriah Maruyama, Katherene Ponto 11/04/2020, 8:04 AM

## 2020-11-04 NOTE — Progress Notes (Signed)
Date and time results received: 11/04/20 0650   Test: [place] Critical Value: >130  Name of Provider Notified: Rory Percy  Orders Received: None  Hart Rochester, RN

## 2020-11-04 NOTE — Progress Notes (Addendum)
STROKE TEAM PROGRESS NOTE   INTERVAL HISTORY Since yesterday his Kidneys have begun to fail as his Creatinine has increased from 2.71 to 5.13. He continues to show no signs of higher function only able to breath above the ventilator. Called patients sisters with updates given his loss of all reflex's. They were understanding that patient will not recover from this. They requested I call their brother to inform him of the situation. He reports understanding that care will be transitioned to comfort care. He reports that he will drive up from [facility] tomorrow morning.   CCM and NeuroSurgery are following  Vitals:   11/04/20 0700 11/04/20 0715 11/04/20 0724 11/04/20 0730  BP: 108/75 108/73 108/73 109/77  Pulse: 93 93 93 92  Resp: (!) 30 (!) 30 (!) 30 (!) 30  Temp:      TempSrc:      SpO2: 95% 95% 95% 95%  Weight:      Height:       CBC:  Recent Labs  Lab 11/03/20 0640 11/04/20 0532  WBC 20.7* 21.4*  HGB 13.3 11.5*  HCT 45.6 39.1  MCV 85.2 86.3  PLT 377 [id number]   Basic Metabolic Panel:  Recent Labs  Lab 11/03/20 0524 11/03/20 1251 11/04/20 0059 11/04/20 0532  NA 160*   < > 161* 159*  K 4.2  --   --  4.3  CL 129*  --   --  >130*  CO2 24  --   --  18*  GLUCOSE 175*  --   --  186*  BUN 66*  --   --  106*  CREATININE 2.71*  --   --  5.13*  CALCIUM 8.0*  --   --  7.3*  MG 2.6*  --   --  2.4  PHOS 4.7*  --   --  5.6*   < > = values in this interval not displayed.   Lipid Panel:  Recent Labs  Lab 10/30/20 0539  TRIG 108   HgbA1c: No results for input(s): HGBA1C in the last 168 hours. Urine Drug Screen: No results for input(s): LABOPIA, COCAINSCRNUR, LABBENZ, AMPHETMU, THCU, LABBARB in the last 168 hours.  Alcohol Level No results for input(s): ETH in the last 168 hours.  IMAGING past 24 hours EEG adult  Result Date: 11/03/2020 Raenette Rover, MD     11/03/2020 12:51 PM EEG Report Indication: possible seizure activity--unresponsive; right parietal drain This study was  recorded in the comatose/not medically induced state.  The duration of the study was 24 minutes.  Electrodes were placed according to the International 10/20 system.  Video was reviewed available for clinical correlation as needed. The background is nearly isoelectric, with frequencies mostly below 10 V, likely less than 5 V, although there is possibly some degree of very low amplitude cortical electrical activity in the interior regions, slightly greater in the right versus the left hemisphere. This is comprised exclusively of <0.5-1 Hz delta, well under 20 microvolts, and could potentially represent artifact vs true physiologic signal of cortical origin.  There was no reactivity to stimulation and minimal variability. On video, subtle right eyelid twitching is noted without clear electrographic correlation. Hyperventilation: deferred Photic stimulation: deferred Impression: This was an abnormal study due to a very suppressed/low-voltage backgrounds, being nearly isoelectric ("flat line")--there may have been some very low amplitude R>L frontal region electrical activity of cortical origin vs artifact--on the whole, the latter is favored.  Right eyelid twitching (subtle) was seen without corresponding electrographic correlation,  and no epileptiform abnormalities were seen at any point. The above findings are most consistent with a severe diffuse encephalopathy. This pattern is seen in the context of hypoxic injury without clear confounders (metabolic, temperature, sedation) it is typically highly specific for a very poor prognosis for recovery of consciousness, a finding which seems to hold prognostic value regardless of use of hypothermia protocol.    PHYSICAL EXAM Blood pressure 109/77, pulse 92, temperature (!) 101.3 F (38.5 C), temperature source Axillary, resp. rate (!) 30, height '5\' 10"'$  (1.778 m), weight (!) 144.9 kg, SpO2 95 %.  General: intubated, obese African American Male, no apparent  distress  Lungs: Symmetrical Chest rise, no labored breathing  Cardio: Regular Rate and Rhythm  Abdomen: Soft, non-tender  Neuro: intubated and comatose. Pupils have dilated to approximately 5 mm and are not responsive to light. Has no gag, cough, or corneal reflex No movement to noxious stimuli  ASSESSMENT/PLAN Mr. RAYSHAN RAVENSCRAFT is a 40 y.o. male with history of HTN, prior stroke(about 7 years ago per patient) with mild R sided weakness, uses a cane to walk who presents with R arm numbness and worsening of his R sided weakness. He was sitting at his porch eating salad when he had sudden onset R sided weakness, mostly with his arm and also his leg. He is R handed. Writes with his R hand. Uses a cane but can walk around fine. Reports had a stroke 7 years ago with residual mild R sided weakness.Also endorses shortness of breath since yesterday which has gotten worse today.  He appears to going into organ failure. His family understands that at this point he is near brain death. They will transition to comfort care.  Stroke -LeftSuperiorCerebellum,Artery ofPercheron, andLeftFrontal Parietal Cortexinfarctss/p tPA,secondary tocardiogenic emboli left ventricular clot versus mass  CT showed left MCA infarct, old lacunar infarct at left thalamus and left cerebellum.   CTA head and neck negative for LVO but multifocal pneumonia.   MRI showedleft MCA large infarct, bilateral thalamus left MCA small infarcts.  CT head 3/18 showed stable infarct but mild hydrocephalus.  CT head 3/20Acute lateral and 3rd ventriculomegaly with transependymal edemasecondary to loss of basilar cisterns and obstructed 4th ventricle.  CT head 3/22 945 -Right superior frontal approach EVD position is stable. There is now with a small volume of blood tracking along the catheter, small volume of 3rd and right lateral intraventricular hemorrhage. Mildly enlarged ventricle size since 1527 hours on  10/31/2020 with some transependymal edema, but still improved compared to the pre-drainage CT. 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 See series 3, image 8. Severe posterior fossa mass effect persists. No significant change in confluent bi thalamic edema, also left parietal lobe cytotoxic edema with petechial hemorrhage.  CT Head 3/22 2215 - Repositioned EVD catheter is only draining the left side. The right lateral ventricle remains dilated while the left is decompressed. Unchanged hemorrhage and severe edema in the left cerebellum resulting in upward transtentorial herniation of the brainstem, unchanged. Otherwise unchanged study.  2D Echo-EF 40%, LV largemass versus clot.   EEG negative for seizure.  [id number]  HgbA1c8.3  VTE prophylaxis -heparin IV  No antithromboticprior to admission, was on IV Heparin, now d/c due to hemorrhagic conversion and new IVH  Disposition:pt neuro decline 3/23, discussed with two sisters and they are leaning towards comfort care but would like to hold off for 2 days until pt brother completed his surgery and able to  express his opinion  Obstructive Hydrocephalus  CT head3/20Acute lateral and 3rd ventriculomegaly with transependymal edema secondary to loss of basilar cisterns and obstructed 4th ventricle.  Neurosurgery consulted and EVD placed (3/20)  ICP goal <20  EVD open at 10  Repeat CT 3/20 showeddecreased ventricular size.  Repeat CT 3/22 showed increased ventricular size but still smaller than prior to EVD  Old EVD removal and new EVD placed (3/22)  Repeat CT 3/22 showed new IVH - new EVD again clogged  Hemorrhagic conversion - left cerebellum  IVH  CT 3/22 showed left cerebellum small hemorrhagic conversion and new IVH s/p new EVD  Heparin IV discontinued.  Cytotoxic Edema  CT headLeft cerebellar infarct with abundant cytotoxic edema demonstrates  new petechial hemorrhage.  Na Q6h  Goal Na 155-160  EVD placed 3/20  Na 156->158->161>159->163  3%Salineat75 cc/hr-> NS @ 25->free water 300 Q6  Hypotension->hypertension->hypotension  Hypotensive after increased sedation  MAP goal >65  3/21 became hypertensive  3/23 became hypotensive again  Back onLevophedagain  Off Coreg  Acute Respiratory Failure  Secondary to stroke  Sedatedon vent  CCM onboard  PossibleRight Lung Pneumoniaon CTA  Unasyn-> Ancef  Tmax 100.7->100.9->101.5  Left Ventricle large mass vs Clot Tachycardia   TTE:EF 40%, LV largemass versus clot  HeparinIV per stroke protocol  Tachycardia HR 120s  Hyperlipidemia  Home meds:none  LDL 52, goal < 70  OnCrestor '5mg'$  daily, no high intensity given low level of LDL  Diabetes type II Uncontrolled  Home meds:None  HgbA1c 8.3, goal < 7.0  CBGs  SSI  Tobacco abuse  Current smoker  Smoking cessation counselingwill beprovided  Other Stroke Risk Factors  Substance abuse - UDS: THC POSITIVE,patientwill beadvised to stop using   Obesity,Body mass index is 48.08 kg/m., BMI >/= 30 associated with increased stroke risk, recommend weight loss, diet and exercise as appropriate   Hx stroke(approximately 2015)  Other Active Problems  CKD stage IV, Cre2.32-2.47-2.71-2.52->2.29->2.71>5.13   Hospital day # [place] MD Resident  ATTENDING NOTE: I reviewed above note and agree with the assessment and plan. Pt was seen and examined.   RN at bedside.  Patient condition continued to deteriorate with worsening kidney function.  Neurologically near brain death, loss of all cranial reflexes and brain function except still breathing over the vent.  Very poor prognosis, discussed with family, they are still in discussion but leaning towards to comfort care measures.  Plan to make decision tomorrow.  I also discussed with Dr. Christella Noa and Dr Tamala Julian.     For detailed assessment and plan, please refer to above as I have made changes wherever appropriate.   Rosalin Hawking, MD PhD Stroke Neurology 11/04/2020 6:03 PM     To contact Stroke Continuity provider, please refer to [email or web address] After hours, contact General Neurology

## 2020-11-04 NOTE — Progress Notes (Signed)
Jesus Osborne aware sodium still elevated at 161 despite change in blood draw from central to peripheral. Increasing free water flushes from 384m/hr to 350.  AHart [place] RN

## 2020-11-05 ENCOUNTER — Inpatient Hospital Stay (HOSPITAL_COMMUNITY): Payer: Medicaid Other

## 2020-11-05 DIAGNOSIS — I639 Cerebral infarction, unspecified: Secondary | ICD-10-CM

## 2020-11-05 LAB — MAGNESIUM: Magnesium: 2.5 mg/dL — ABNORMAL HIGH (ref 1.7–2.4)

## 2020-11-05 LAB — BLOOD CULTURE ID PANEL (REFLEXED) - BCID2

## 2020-11-05 LAB — BASIC METABOLIC PANEL
Anion gap: 11 (ref 5–15)
BUN: 106 mg/dL — ABNORMAL HIGH (ref 6–20)
BUN: 125 mg/dL — ABNORMAL HIGH (ref 6–20)
CO2: 18 mmol/L — ABNORMAL LOW (ref 22–32)
CO2: 18 mmol/L — ABNORMAL LOW (ref 22–32)
Calcium: 7.3 mg/dL — ABNORMAL LOW (ref 8.9–10.3)
Calcium: 7.9 mg/dL — ABNORMAL LOW (ref 8.9–10.3)
Chloride: >130 mmol/L (ref 98–111)
Chloride: 124 mmol/L — ABNORMAL HIGH (ref 98–111)
Creatinine, Ser: 5.13 mg/dL — ABNORMAL HIGH (ref 0.61–1.24)
Creatinine, Ser: 6.14 mg/dL — ABNORMAL HIGH (ref 0.61–1.24)
GFR, Estimated: 14 mL/min — ABNORMAL LOW (ref >60)
GFR, Estimated: 11 mL/min — ABNORMAL LOW (ref >60)
Glucose, Bld: 186 mg/dL — ABNORMAL HIGH (ref 70–99)
Glucose, Bld: 225 mg/dL — ABNORMAL HIGH (ref 70–99)
Potassium: 4.3 mmol/L (ref 3.5–5.1)
Potassium: 4.6 mmol/L (ref 3.5–5.1)
Sodium: 159 mmol/L — ABNORMAL HIGH (ref 135–145)
Sodium: 153 mmol/L — ABNORMAL HIGH (ref 135–145)

## 2020-11-05 LAB — CBC
HCT: 38 % — ABNORMAL LOW (ref 39.0–52.0)
Hemoglobin: 11.2 g/dL — ABNORMAL LOW (ref 13.0–17.0)
MCH: 25.1 pg — ABNORMAL LOW (ref 26.0–34.0)
MCHC: 29.5 g/dL — ABNORMAL LOW (ref 30.0–36.0)
MCV: 85 fL (ref 80.0–100.0)
Platelets: 167 10*3/uL (ref 150–400)
RBC: 4.47 MIL/uL (ref 4.22–5.81)
RDW: 19.1 % — ABNORMAL HIGH (ref 11.5–15.5)
WBC: 26.1 10*3/uL — ABNORMAL HIGH (ref 4.0–10.5)
nRBC: 0.2 % (ref 0.0–0.2)

## 2020-11-05 LAB — SODIUM
Sodium: 157 mmol/L — ABNORMAL HIGH (ref 135–145)
Sodium: 153 mmol/L — ABNORMAL HIGH (ref 135–145)

## 2020-11-05 LAB — FACTOR 5 LEIDEN

## 2020-11-05 LAB — GLUCOSE, CAPILLARY
Glucose-Capillary: 167 mg/dL — ABNORMAL HIGH (ref 70–99)
Glucose-Capillary: 186 mg/dL — ABNORMAL HIGH (ref 70–99)
Glucose-Capillary: 268 mg/dL — ABNORMAL HIGH (ref 70–99)

## 2020-11-05 LAB — CULTURE, RESPIRATORY W GRAM STAIN

## 2020-11-05 LAB — PROCALCITONIN: Procalcitonin: 0.43 ng/mL

## 2020-11-05 LAB — VANCOMYCIN, RANDOM: Vancomycin Rm: 28

## 2020-11-05 LAB — PHOSPHORUS: Phosphorus: 7.5 mg/dL — ABNORMAL HIGH (ref 2.5–4.6)

## 2020-11-05 MED ORDER — MORPHINE BOLUS VIA INFUSION
1.0000 mg | INTRAVENOUS | Status: DC | PRN
Start: 2020-11-05 — End: 2020-11-05
  Filled 2020-11-05: qty 2

## 2020-11-05 MED ORDER — MORPHINE 100MG IN NS 100ML (1MG/ML) PREMIX INFUSION
1.0000 mg/h | INTRAVENOUS | Status: DC
  Administered 2020-11-05: 1 mg/h via INTRAVENOUS
  Filled 2020-11-05: qty 100

## 2020-11-07 LAB — CULTURE, BLOOD (ROUTINE X 2): Special Requests: ADEQUATE

## 2020-11-08 LAB — CULTURE, BLOOD (ROUTINE X 2)
Culture: NO GROWTH
Special Requests: ADEQUATE

## 2020-11-12 NOTE — Progress Notes (Signed)
Pharmacy Antibiotic Note  Jesus Osborne is a 40 y.o. male admitted on 10/29/2020 with sepsis.  Pharmacy has been consulted for cefepime/vancomycin dosing. Patient previously on cefazolin s/p EVD placement - now with continued fevers. SCr trended up to 6.13 today.  Vancomycin held yesterday with AKI and random level resulted high this AM at 28.  Plan: Cefepime 1g IV q24h Continue to hold vancomycin for now and recheck level with AM labs Monitor clinical progress, c/s, renal function Planning 7 days of therapy per CCM - stop dates in place for 3/30 F/u GOC discussions    Height: '5\' 10"'$  (177.8 cm) Weight: (!) 148.5 kg (327 lb 6.1 oz) IBW/kg (Calculated) : 73  Temp (24hrs), Avg:98.9 F (37.2 C), Min:98.6 F (37 C), Max:99.4 F (37.4 C)  Recent Labs  Lab 11/01/20 0540 11/01/20 0842 11/02/20 0613 11/02/20 0830 11/02/20 2023 11/03/20 0205 11/03/20 0524 11/03/20 0640 11/04/20 0532 11-26-20 0725  WBC 9.4  --  10.4  --   --   --   --  20.7* 21.4* 26.1*  CREATININE 2.28*   < >  --  2.22* 2.44*  --  2.71*  --  5.13* 6.14*  LATICACIDVEN  --   --   --   --   --  1.7  --   --   --   --   VANCORANDOM  --   --   --   --   --   --   --   --   --  28   < > = values in this interval not displayed.    Estimated Creatinine Clearance: 23.8 mL/min (A) (by C-G formula based on SCr of 6.14 mg/dL (H)).    No Known Allergies  Arturo Morton, PharmD, BCPS Please check AMION for all [place] contact numbers Clinical Pharmacist 26-Nov-2020 11:14 AM

## 2020-11-12 NOTE — Progress Notes (Addendum)
STROKE TEAM PROGRESS NOTE   INTERVAL HISTORY His sisters and brother are at the bedside. He is only breathing above the ventilator. He has no response to noxious stimuli, he has no reflex's. Family has decided to transition to comfort care. Will start Morphine drip and place comfort care orders.  Transitioned to comfort care  Vitals:   2020-11-23 0600 11/23/2020 0700 11-23-2020 0716 11-23-2020 0800  BP: '92/76 97/72 96/71 '$   Pulse: 100 99 100 (!) 102  Resp: (!) 30 (!) 30 13 (!) 30  Temp:    98.9 F (37.2 C)  TempSrc:    Axillary  SpO2: 96% 98% 97% 98%  Weight:      Height:       CBC:  Recent Labs  Lab 11/04/20 0532 11-23-2020 0725  WBC 21.4* 26.1*  HGB 11.5* 11.2*  HCT 39.1 38.0*  MCV 86.3 85.0  PLT 229 [id number]   Basic Metabolic Panel:  Recent Labs  Lab 11/03/20 0524 11/03/20 1251 11/04/20 0532 11/04/20 1304 11/04/20 1901 11-23-20 0100  NA 160*   < > 159*   < > 155* 157*  K 4.2  --  4.3  --   --   --   CL 129*  --  >130*  --   --   --   CO2 24  --  18*  --   --   --   GLUCOSE 175*  --  186*  --   --   --   BUN 66*  --  106*  --   --   --   CREATININE 2.71*  --  5.13*  --   --   --   CALCIUM 8.0*  --  7.3*  --   --   --   MG 2.6*  --  2.4  --   --   --   PHOS 4.7*  --  5.6*  --   --   --    < > = values in this interval not displayed.   Lipid Panel:  Recent Labs  Lab 10/30/20 0539  TRIG 108   HgbA1c: No results for input(s): HGBA1C in the last 168 hours. Urine Drug Screen: No results for input(s): LABOPIA, COCAINSCRNUR, LABBENZ, AMPHETMU, THCU, LABBARB in the last 168 hours.  Alcohol Level No results for input(s): ETH in the last 168 hours.  IMAGING past 24 hours DG Abd Portable 1V  Result Date: Nov 23, 2020 CLINICAL DATA:  OG tube placement EXAM: PORTABLE ABDOMEN - 1 VIEW COMPARISON:  10/26/2020 FINDINGS: Transesophageal tube tip terminates in the expected location of the gastric body with the side port at the GE junction. Consider advancing 3-5 cm for optimal function.  Diffuse heterogeneous opacities are seen throughout the imaged portions of the lungs, similar to slightly diminished from comparison radiography. Cardiomegaly is noted. Likely right IJ approach central venous catheter tip terminates near the lower SVC. Telemetry leads overlie the chest. Gaseous distention of the bowel in the upper abdomen, favor colonic segments. Appearance is nonspecific, correlate with abdominal symptoms. Lower abdomen is collimated. IMPRESSION: 1. Transesophageal tube tip terminates in the expected location of the gastric body with the side port at the GE junction. Consider advancing 3-5 cm for optimal function. 2. Diffuse heterogeneous opacities throughout the imaged portions of the lungs, similar to slightly diminished from comparison radiography. 3. Gaseous distention of the upper abdominal bowel, nonspecific, correlate for abdominal symptoms. Incomplete visualization of the lower abdomen. Electronically Signed   By: Elwin Sleight.D.  On: 20-Nov-2020 01:29    PHYSICAL EXAM Blood pressure 96/71, pulse (!) 102, temperature 98.9 F (37.2 C), temperature source Axillary, resp. rate (!) 30, height '5\' 10"'$  (1.778 m), weight (!) 148.5 kg, SpO2 98 %.  General: intubated, obese african [place] male, no apparent distress  Lungs: Symmetrical Chest rise, no labored breathing  Cardio: Regular Rate and Rhythm  Abdomen: Soft, non-tender  Neuro: Intubated, non-responsive. No reflex's present. Only breathing above the ventilator. Pupils are dilated and fixed. No response to noxious stimuli.  ASSESSMENT/PLAN Jesus Osborne is a 40 y.o. male with history of HTN, prior stroke(about 7 years ago per patient) with mild R sided weakness, uses a cane to walk who presents with R arm numbness and worsening of his R sided weakness. He was sitting at his porch eating salad when he had sudden onset R sided weakness, mostly with his arm and also his leg. He is R handed. Writes with his R hand. Uses a  cane but can walk around fine. Reports had a stroke 7 years ago with residual mild R sided weakness.Also endorses shortness of breath since yesterday which has gotten worse today.  He appears to going into organ failure. His family understands that at this point he is near brain death. They will transition to comfort care. Will start Morphine drip and transition to comfort care.  Stroke -LeftSuperiorCerebellum,Artery ofPercheron, andLeftFrontal Parietal Cortexinfarctss/p tPA,secondary tocardiogenic emboli left ventricular clot versus mass  CT showed left MCA infarct, old lacunar infarct at left thalamus and left cerebellum.   CTA head and neck negative for LVO but multifocal pneumonia.   MRI showedleft MCA large infarct, bilateral thalamus left MCA small infarcts.  CT head 3/18 showed stable infarct but mild hydrocephalus.  CT head 3/20Acute lateral and 3rd ventriculomegaly with transependymal edemasecondary to loss of basilar cisterns and obstructed 4th ventricle.  CT head 3/22945-Right superior frontal approach EVD position is stable. There is now with a small volume of blood tracking along the catheter, small volume of 3rd and right lateral intraventricular hemorrhage. Mildly enlarged ventricle size since 1527 hours on 10/31/2020 with some transependymal edema, but still improved compared to the pre-drainage CT. 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 See series 3, image 8. Severe posterior fossa mass effect persists. No significant change in confluent bi thalamic edema, also left parietal lobe cytotoxic edema with petechial hemorrhage.  CT Head 3/22 2215 -Repositioned EVD catheter is only draining the left side. The right lateral ventricle remains dilated while the left is decompressed. Unchanged hemorrhage and severe edema in the left cerebellum resulting in upward transtentorial herniation of the  brainstem, unchanged. Otherwise unchanged study.  2D Echo-EF 40%, LV largemass versus clot.   EEG negative for seizure.  [id number]  HgbA1c8.3  VTE prophylaxis -SCD's  No antithromboticprior to admission,wason IV Heparin, now d/c due to hemorrhagic conversion and new IVH  Disposition:pt neuro decline 3/23, discussed with two sisters and they are leaning towards comfort care but would like to hold off for 2 days until pt brother completed his surgery and able to express his opinion  Obstructive Hydrocephalus  CT head3/20Acute lateral and 3rd ventriculomegaly with transependymal edema secondary to loss of basilar cisterns and obstructed 4th ventricle.  Neurosurgery consulted and EVD placed (3/20)  ICP goal <20  EVD open at 10  Repeat CT 3/20 showeddecreased ventricular size.  Repeat CT 3/22 showed increased ventricular size but still smaller than prior to EVD  Old  EVD removal and new EVD placed (3/22)  Repeat CT 3/22 showed new IVH - new EVD again clogged  Hemorrhagic conversion - left cerebellum  IVH  CT 3/22 showed left cerebellum small hemorrhagic conversion and new IVH s/p new EVD  Heparin IV discontinued.  Cytotoxic Edema  CT headLeft cerebellar infarct with abundant cytotoxic edema demonstrates new petechial hemorrhage.  Na Q6h  Goal Na 155-160  EVD placed 3/20  Na 156->158->161>159->163  3%Salineat75 cc/hr-> NS @ 25->free water 300 Q6  Hypotension->hypertension->hypotension  Hypotensive after increased sedation  MAP goal >65  3/21 became hypertensive  3/23 became hypotensive again  Back onLevophedagain  OffCoreg  Transitioned to comfort care  Acute Respiratory Failure  Secondary to stroke  Sedatedon vent  CCM onboard  PossibleRight Lung Pneumoniaon CTA  Unasyn-> Ancef  Tmax 100.7->100.9->101.5  Transitioned to comfort care  Left Ventricle large mass vs Clot Tachycardia  TTE:EF 40%, LV  largemass versus clot  HeparinIV per stroke protocol  Tachycardia HR 120s  Hyperlipidemia  Home meds:none  LDL 52, goal < 70  Transitioned to comfort care  Diabetes type II Uncontrolled  Home meds:None  HgbA1c 8.3, goal < 7.0  Transitioned to comfort care  Tobacco abuse   Other Stroke Risk Factors   Obesity,Body mass index is 48.08 kg/m., BMI >/= 30 associated with increased stroke risk   Hx stroke(approximately 2015)  Other Active Problems  CKD stage IV, Cre2.32-2.47-2.71-2.52->2.29->2.71>5.13>6.14  Hospital day # [place] MD Resident  ATTENDING NOTE: I reviewed above note and agree with the assessment and plan. Pt was seen and examined.   Pt still intubated on vent. Still able to breath over the vent, however, lost all other cerebral reflexes. Near brain death. Family will coming around 2pm for comfort care measures. Pt brother will come from [facility], pt two sister also will be here to say goodbye. Will do comfort care whenever family ready.   Rosalin Hawking, MD PhD Stroke Neurology 2020-11-26 10:15 AM    To contact Stroke Continuity provider, please refer to [email or web address] After hours, contact General Neurology

## 2020-11-12 NOTE — Progress Notes (Signed)
NAME:  Jesus Osborne, MRN:  [id number], DOB:  1980-11-13, LOS: 10 ADMISSION DATE:  11/10/2020, CONSULTATION DATE:  10/26/20 REFERRING MD:  Lorrin Goodell, CHIEF COMPLAINT:  AMS   Brief History:  Jesus Osborne is a 40 y.o. male who was admitted 3/14 with CVA s/p tPA.  While in [place], had hypoxia requiring NRB then 2L O2 via [place].  Later had change in mental status with minimal response; therefore, intubated before returning to CT.  History of Present Illness:  Pt is encephelopathic; therefore, this HPI is obtained from chart review. Jesus Osborne is a 40 y.o. male who has a PMH including but not limited to prior CVA, HTN, THC and tobacco dependence (see "past medical history" for rest).  He presented to [facility] ED 3/14 with R arm numbness and R sided weakness.  He was taken to CT which was concerning for evolving L MCA infarct.  HE received tPA at 002 (slighly delay due to difficult IV access).  While in CT, he had desaturation requiring NRB and later placed on 2L O2 via [place]. While in ED, later had change in mental status around 0115 with left gaze deviation and decrease in alertness.  Decision made to intubate prior to taking back to CT for repeat scan.  Past Medical History:  has Acute ischemic left MCA stroke ([place]) on their problem list.  Significant Hospital Events:  -3/14 > admit. -3/15 > intubated MRI head 3/15 (06:40)>  1.Acute infarcts in the left left superior cerebellum, artery of percheron territory, and left frontal parietal cortex. 2. Petechial hemorrhage in the left parietal region. 3. Remote hypertensive pattern hemorrhages. -Echo 3/15>>20x12m moderately mobile mass on inferior wall of the LV, increased echogenicity likely representing thrombus but cannot exclude neoplasm, EF 40% -CT head 3/15>>posterior mass effect with partial effacement of 4th ventricle but no obstructive hydrocephalus -EEG 3/15-3/16>> moderate diffuse encephalopathy. No epileptiform discharges -CT head 3/18>>  worsened mass effect almost entirely effacing 4th ventricle with increased ventriculomegaly 15 (01:56)> no acute intracranial hemorrhage - 3/15 started on 3% to mitigate edema - 3/17 family meeting with neurology.  Family aware and realistic regarding possibility for poor neurological recovery.  Plan to observe for recovery over the next 4 days. -3/18 more awake today- 3/19 exam decline overnight with worsening hydrocephalus on CT  -3/20 EVD placed after heparin washout.  -3/21 improvement in exam -3/22 worsening exam, EVD clogged, replaced, worsening edema and hemorrhage on CT   Interim History / Subjective:  No events. Remains comatose on vent.  Objective   Blood pressure 92/76, pulse 100, temperature 98.9 F (37.2 C), temperature source Oral, resp. rate (!) 30, height '5\' 10"'$  (1.778 m), weight (!) 148.5 kg, SpO2 96 %. CVP:  [14 mmHg-66 mmHg] 16 mmHg  Vent Mode: PRVC FiO2 (%):  [40 %] 40 % Set Rate:  [30 bmp] 30 bmp Vt Set:  [580 mL] 580 mL PEEP:  [5 cmH20] 5 cmH20 Plateau Pressure:  [22 cmH20-24 cmH20] 24 cmH20   Intake/Output Summary (Last 24 hours) at [id number] data filed at 32022/03/300600 Gross per 24 hour  Intake 3791.04 ml  Output 1752 ml  Net 2039.04 ml   Filed Weights   10/31/20 0400 11/01/20 0400 [id number]  Weight: (!) 148.7 kg (!) 144.9 kg (!) 148.5 kg    Examination: Constitutional: unresponsive man on vent  Eyes: pupils fixed dialted Ears, nose, mouth, and throat: ETT with minimal secretions Cardiovascular: RRR, ext warm Respiratory: scattered rhonci, no accessory  muscle use Gastrointestinal: soft, hypoactive BS Skin: No rashes, normal turgor Neurologic:  GCS3 No brainstem reflexes Does trigger vent Psychiatric: cannot assess  Renal function continues to deteriorate WBC worse CBG okay Pressor requirements worse  Resolved Hospital Problem list   Hyponatremia  Assessment & Plan:    - 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, herniation, obstructive hydrocephalus, nonsurvivable - ARDS - Worsening multiorgan failure due to herniation and sepsis - Septic and neurogenic shock - Approaching end of life - Iatrogenic severe hypernatremia  Plan: - Continue broad spectrum antibiotics, vent support, pressors - VAP prevention bundle - Can use opiates if any s/s of distress - f/u culture data - No further procedures, not HD candidate - Family to come in today and likely transition to comfort care  Best practice:  Diet: TF Pain/Anxiety/Delirium protocol (if indicated): fentanyl VAP protocol (if indicated): in place DVT prophylaxis: SCDs given worsening hemorrhagic transformation GI prophylaxis: PPI Glucose control: SSI + levemir Mobility: BR Code Status: full Family Communication: per primary Disposition: ICU  Patient critically ill due to respiratory failure, shock Interventions to address this today ventilator titration Risk of deterioration without these interventions is high  I personally spent 32 minutes providing critical care not including any separately billable procedures  Erskine Emery MD [facility]  Prefer epic messenger for cross cover needs If after hours, please call E-link

## 2020-11-12 NOTE — Death Summary Note (Signed)
Patient ID: Jesus Osborne MRN: [id number] DOB/AGE: [birth year] 39 y.o.  Admit date: 2020-11-12 Death date: Nov 23, 2020  Admission Diagnoses: LeftSuperiorCerebellum,Artery ofPercheron territory, andLeftFrontal Parietal Cortexinfarcts secondary tocardiogenic emboli left ventricular clot versus mass  Cause of Death:  Brain Death  Pertinent Medical Diagnosis: Active Problems:   Acute ischemic left MCA stroke [facility])   Hospital Course: Patient presented to [facility] on 11[facility]13/2022 due to Right Arm Numbness. CT head revealed - Wedge-shaped hypodensity at the posterior left parietal lobe, consistent with an evolving posterior left MCA distribution infarct. No hemorrhage. ASPECTS is 9. Additional remote lacunar infarcts at the left thalamus and left cerebellum. He was given tPA. Later in the ED he had a change in mental status, left gaze deviation, and decrease in alertness. He was intubated and repeat CT showed - Redemonstrated acute/early subacute infarcts within the left frontal and parietal lobes, superior left cerebellum and bilateral ventromedial midbrain and medial thalami. Mild petechial hemorrhage within the left parietal lobe was better appreciated on the brain MRI performed earlier today. There is posterior fossa mass effect with partial effacement of the fourth ventricle but no evidence of obstructive hydrocephalus at this time. He continued to have fluctuating mental status and had an EVD placed by Neurosurgery on 3/20. He had been making some improvements but on 3/22 he took a turn for the worst. He lost cough, gag, and corneal reflex he was only breathing above the ventilator. Discussed with family the poor prognosis, they understood but wanted to wait until his brother could drive up from [place]. On 11/24/22 family meet at bedside and decided to make him comfort care. At 1500 he was started on the Morphine Drip. At 1527 his ventilator was stopped along with all other medications and lines. He then  passed peacefully with his 2 sisters, brother, and mother present with him at bedside. Time of death was 1530.  Signed: Briant Cedar 11/23/2020, 3:41 PM

## 2020-11-12 NOTE — Progress Notes (Signed)
[facility] notified of pt's cardiac TOD. Referral # [id number]

## 2020-11-12 NOTE — Progress Notes (Signed)
PHARMACY - PHYSICIAN COMMUNICATION CRITICAL VALUE ALERT - BLOOD CULTURE IDENTIFICATION (BCID)  Jesus Osborne is an 40 y.o. male who presented to [facility] on 11/11/2020 with a chief complaint of sepsis  Assessment: Blood cultures growing GPCs in 1/3 bottles. BCID reporting staph species.   Name of physician (or Provider) Contacted: Rosalin Hawking, MD   Current antibiotics: cefepime, vancomycin   Changes to prescribed antibiotics recommended:  Patient is on recommended antibiotics - No changes needed  Results for orders placed or performed during the hospital encounter of 10/21/2020  Blood Culture ID Panel (Reflexed) (Collected: 11/03/2020  2:31 AM)  Result Value Ref Range   Enterococcus faecalis NOT DETECTED NOT DETECTED   Enterococcus Faecium NOT DETECTED NOT DETECTED   Listeria monocytogenes NOT DETECTED NOT DETECTED   Staphylococcus species DETECTED (A) NOT DETECTED   Staphylococcus aureus (BCID) NOT DETECTED NOT DETECTED   Staphylococcus epidermidis NOT DETECTED NOT DETECTED   Staphylococcus lugdunensis NOT DETECTED NOT DETECTED   Streptococcus species NOT DETECTED NOT DETECTED   Streptococcus agalactiae NOT DETECTED NOT DETECTED   Streptococcus pneumoniae NOT DETECTED NOT DETECTED   Streptococcus pyogenes NOT DETECTED NOT DETECTED   A.calcoaceticus-baumannii NOT DETECTED NOT DETECTED   Bacteroides fragilis NOT DETECTED NOT DETECTED   Enterobacterales NOT DETECTED NOT DETECTED   Enterobacter cloacae complex NOT DETECTED NOT DETECTED   Escherichia coli NOT DETECTED NOT DETECTED   Klebsiella aerogenes NOT DETECTED NOT DETECTED   Klebsiella oxytoca NOT DETECTED NOT DETECTED   Klebsiella pneumoniae NOT DETECTED NOT DETECTED   Proteus species NOT DETECTED NOT DETECTED   Salmonella species NOT DETECTED NOT DETECTED   Serratia marcescens NOT DETECTED NOT DETECTED   Haemophilus influenzae NOT DETECTED NOT DETECTED   Neisseria meningitidis NOT DETECTED NOT DETECTED   Pseudomonas  aeruginosa NOT DETECTED NOT DETECTED   Stenotrophomonas maltophilia NOT DETECTED NOT DETECTED   Candida albicans NOT DETECTED NOT DETECTED   Candida auris NOT DETECTED NOT DETECTED   Candida glabrata NOT DETECTED NOT DETECTED   Candida krusei NOT DETECTED NOT DETECTED   Candida parapsilosis NOT DETECTED NOT DETECTED   Candida tropicalis NOT DETECTED NOT DETECTED   Cryptococcus neoformans/gattii NOT DETECTED NOT DETECTED    Claudina Lick, PharmD PGY1 Acute Care Pharmacy Resident 11/23/2020 8:34 AM  Please check [email or web address] for unit-specific pharmacy phone numbers.

## 2020-11-12 NOTE — Procedures (Signed)
Extubation Procedure Note  Patient Details:   Name: Jesus Osborne DOB: 1981-08-05 MRN: [id number]   Airway Documentation:    Vent end date: 2020-11-17 Vent end time: 1527   Evaluation  O2 sats: stable throughout Complications: No apparent complications Patient did tolerate procedure well. Bilateral Breath Sounds: Clear,Diminished   No, pt could not speak post-extubation.  Pt extubated to room air per physician's order and in accordance with family's wishes.  Earney Navy 11/17/2020, 3:28 PM

## 2020-11-12 DEATH — deceased

## 2022-02-19 IMAGING — DX DG CHEST 1V PORT
1 series · 1 of 1 positions shown · non-contrast
Comparison: 10/27/2020

CLINICAL DATA: Fever

EXAM:
PORTABLE CHEST 1 VIEW

[chest ap]
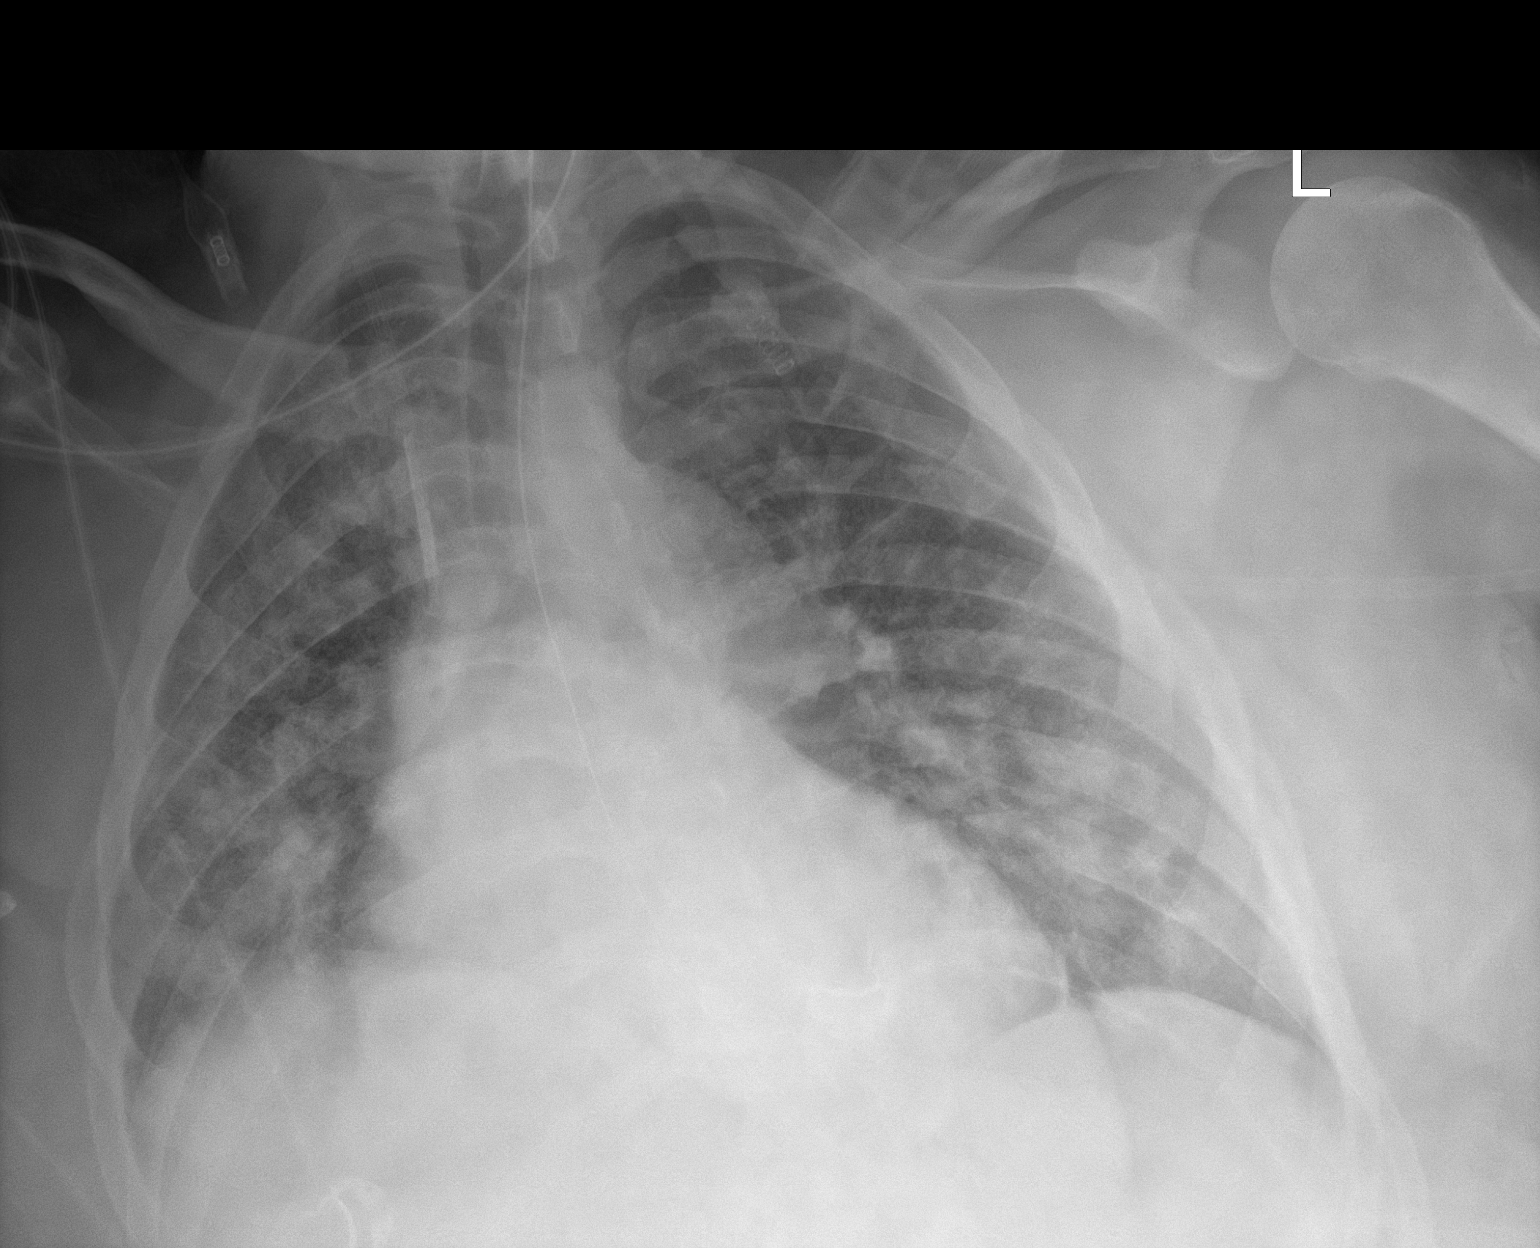

[1 of 1 positions shown; findings below may reference images not displayed]

FINDINGS: Endotracheal tube is quite high within the upper trachea, 11.4 cm
from the carina. A right IJ catheter tip terminates in the mid SVC.
A transesophageal tube tip terminates below the margins of imaging,
beyond the GE a junction. Side port not well visualized. Diffuse
heterogeneous opacities are again seen throughout both lungs with
some mild interval clearing in the right left lung bases.
Cardiomegaly is similar to comparison exam accounting for
differences in technique and inflation. Body wall edema, similar to
prior. No acute osseous or soft tissue abnormality.
IMPRESSION: 1. Endotracheal tube is quite high within the upper trachea, 11.4 cm
from the carina. Consider advancement 5 cm to the mid trachea.
2. Additional support devices as above.
3. Diffuse heterogeneous opacities throughout both lungs with some
mild interval clearing in the right lung bases.

## 2022-02-19 IMAGING — CT CT HEAD W/O CM
4 series · 16 of 47 positions shown, 18 images · non-contrast
Comparison: 11/02/2020 at [DATE] a.m.

CLINICAL DATA: Stroke follow-up

EXAM:
CT HEAD WITHOUT CONTRAST
TECHNIQUE: Contiguous axial images were obtained from the base of the skull
through the vertex without intravenous contrast.

[Series 3: head wo · axial · 0.46mm/px · z∈[+1256,+1376]mm · 7 of 32 slices shown, 9 images]
[im 4/32  brain]
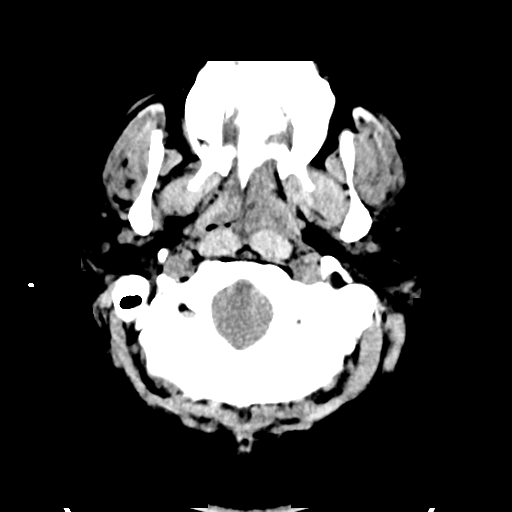
[im 4/32  bone]
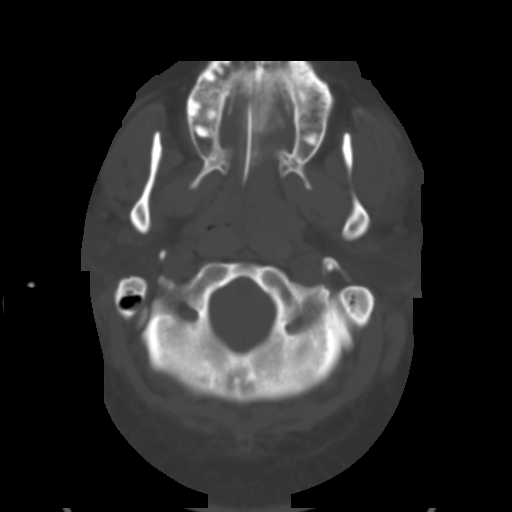
[im 8/32  brain]
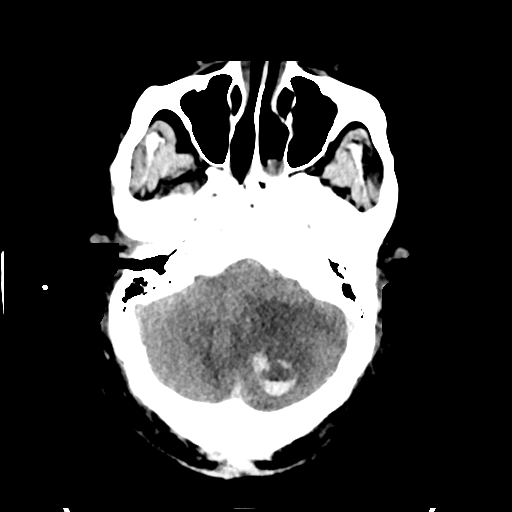
[im 12/32  brain]
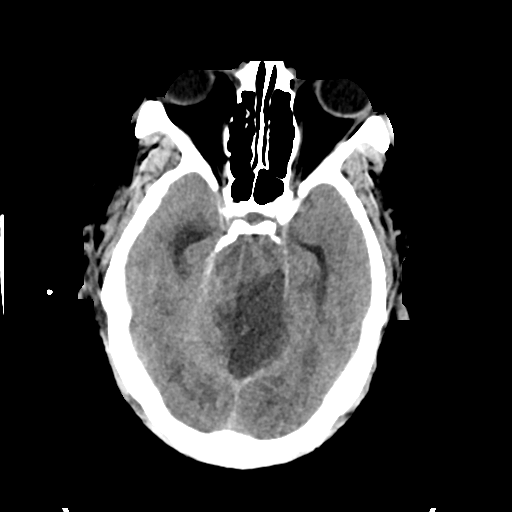
[im 16/32  brain]
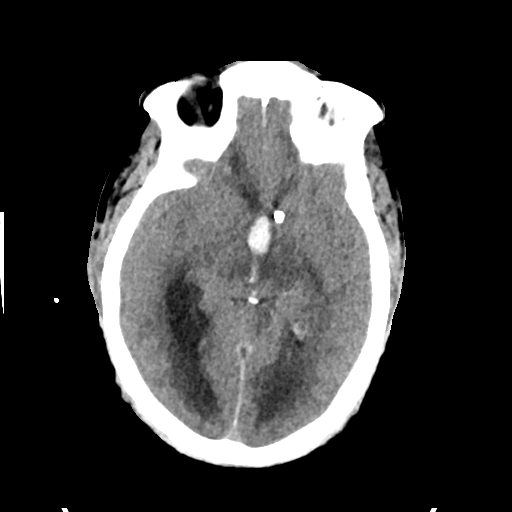
[im 20/32  brain]
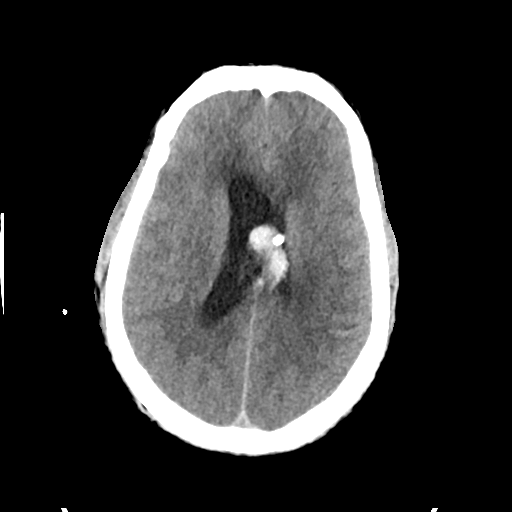
[im 20/32  bone]
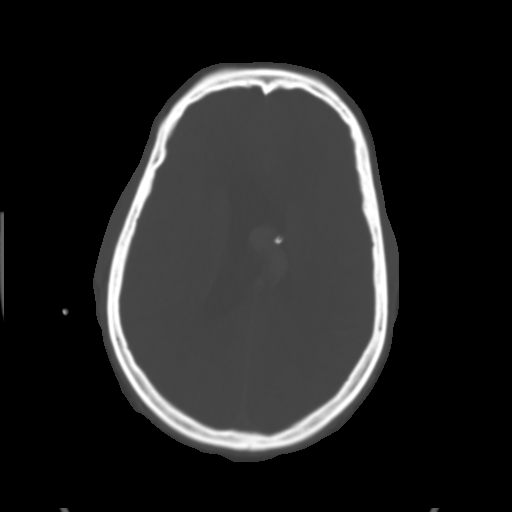
[im 24/32  brain]
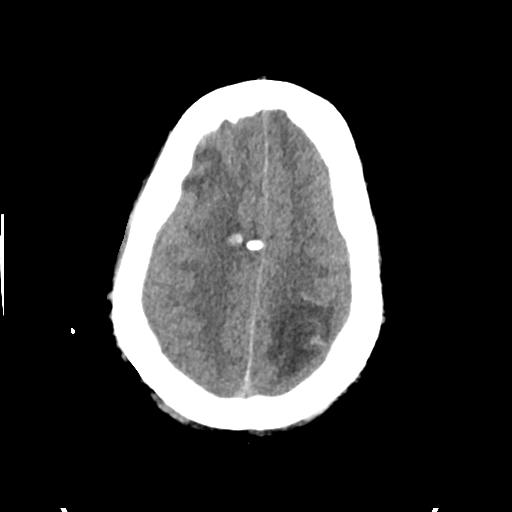
[im 28/32  brain]
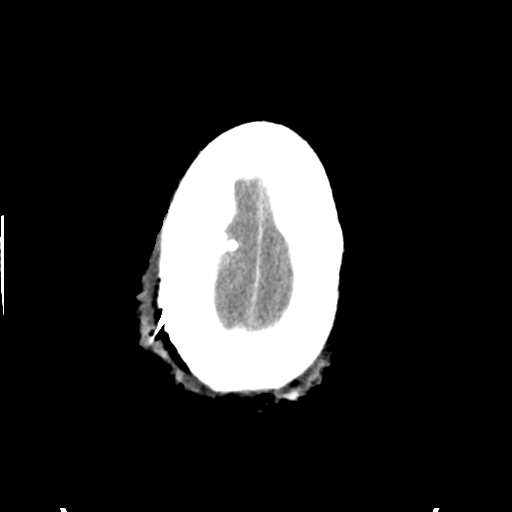

[Series 4: head bone · axial · 0.46mm/px · z∈[+1256,+1288]mm · 3 of 79 slices shown]
[im 8/79  bone]
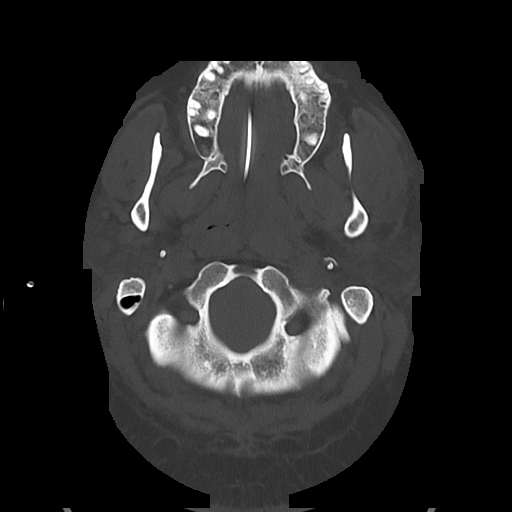
[im 16/79  bone]
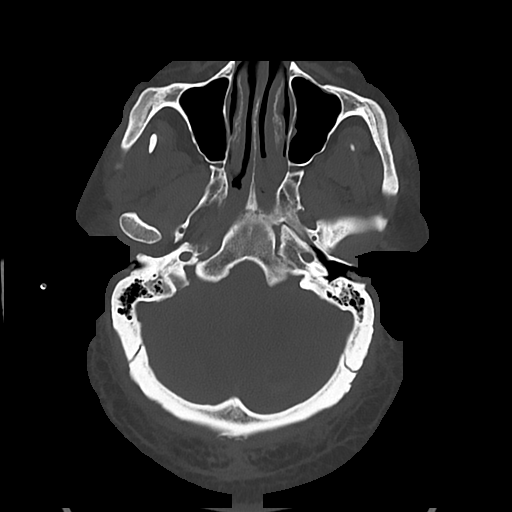
[im 24/79  bone]
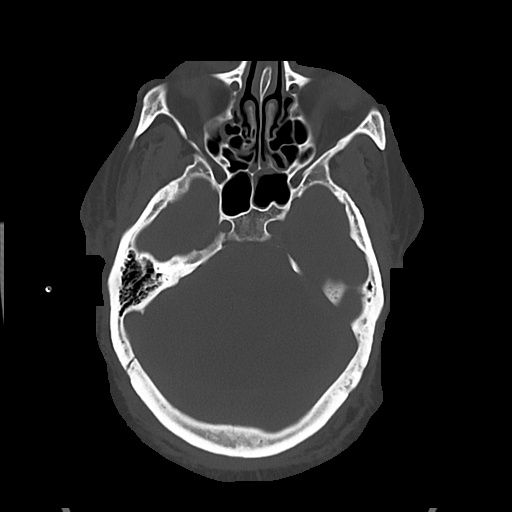

[Series 5: cor soft · coronal · 0.35mm/px · 3 of 79 slices shown]
[im 27/79  brain]
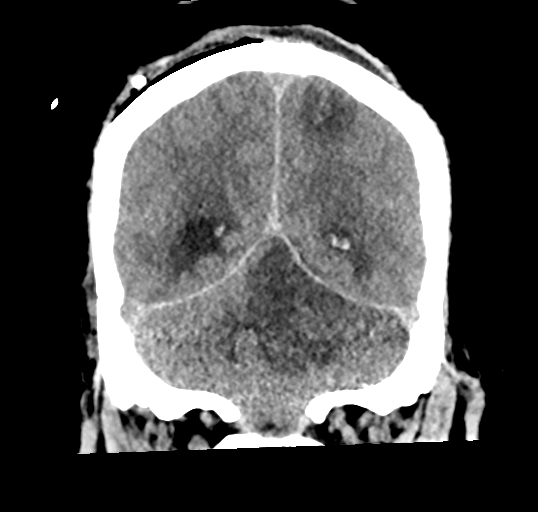
[im 35/79  brain]
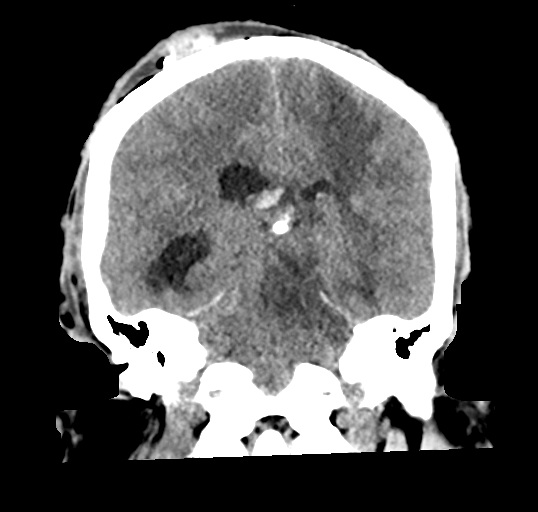
[im 44/79  brain]
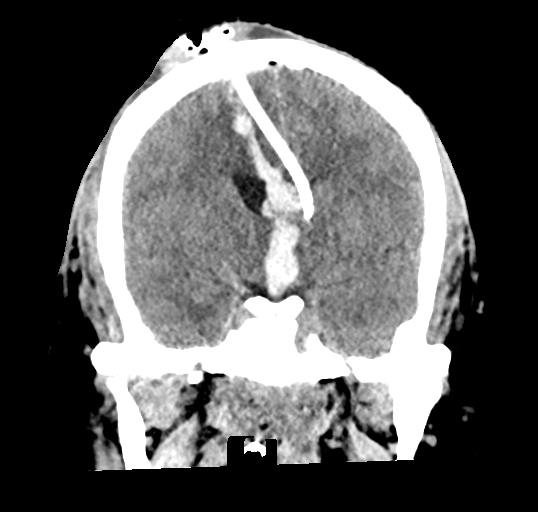

[Series 6: sag soft · sagittal · 0.35mm/px · 3 of 61 slices shown]
[im 21/61  brain]
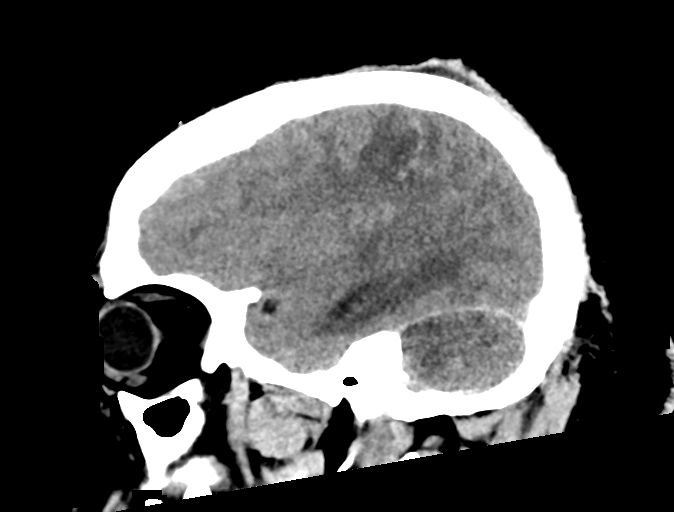
[im 31/61  brain]
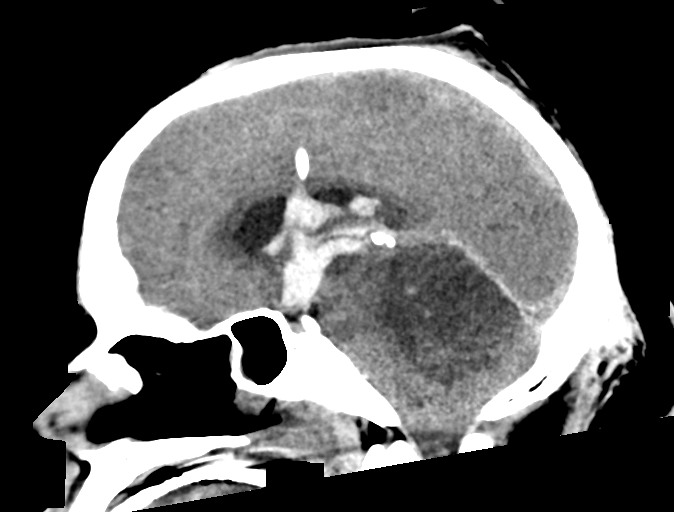
[im 41/61  brain]
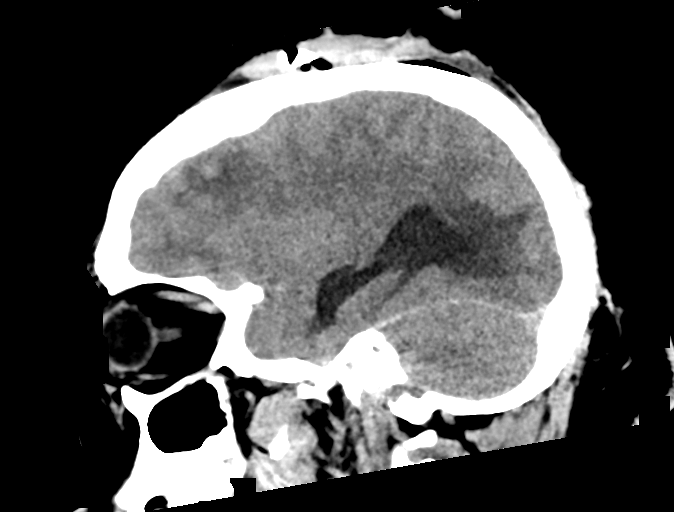

[16 of 47 positions shown; findings below may reference images not displayed]

FINDINGS: Brain: Small amount of blood along the course of the right
extraventricular drain persists. The amount of intraventricular
blood has increased. Multifocal hemorrhage in the left posterior
fossa is unchanged. There is persistent severe mass effects within
the posterior fossa with upward transtentorial herniation of the
brainstem, unchanged the left lateral ventricle is decompressed, but
there is persistent hydrocephalus of the right lateral ventricle.
The tip of the EVD is near the left frontal horn and is likely only
draining the left side. Unchanged appearance of left MCA territory
infarct.

Vascular: Negative

Skull: Right frontal burr hole

Sinuses/Orbits: Negative

Other: None
IMPRESSION: 1. Repositioned EVD catheter is only draining the left side. The
right lateral ventricle remains dilated while the left is
decompressed.
2. Unchanged hemorrhage and severe edema in the left cerebellum
resulting in upward transtentorial herniation of the brainstem,
unchanged.
3. Otherwise unchanged study.

## 2022-02-20 IMAGING — DX DG CHEST 1V
1 series · 1 of 1 positions shown · non-contrast
Comparison: 11/03/2020.

CLINICAL DATA: Endotracheal tube present, fever.

EXAM:
CHEST  1 VIEW

[chest ap]
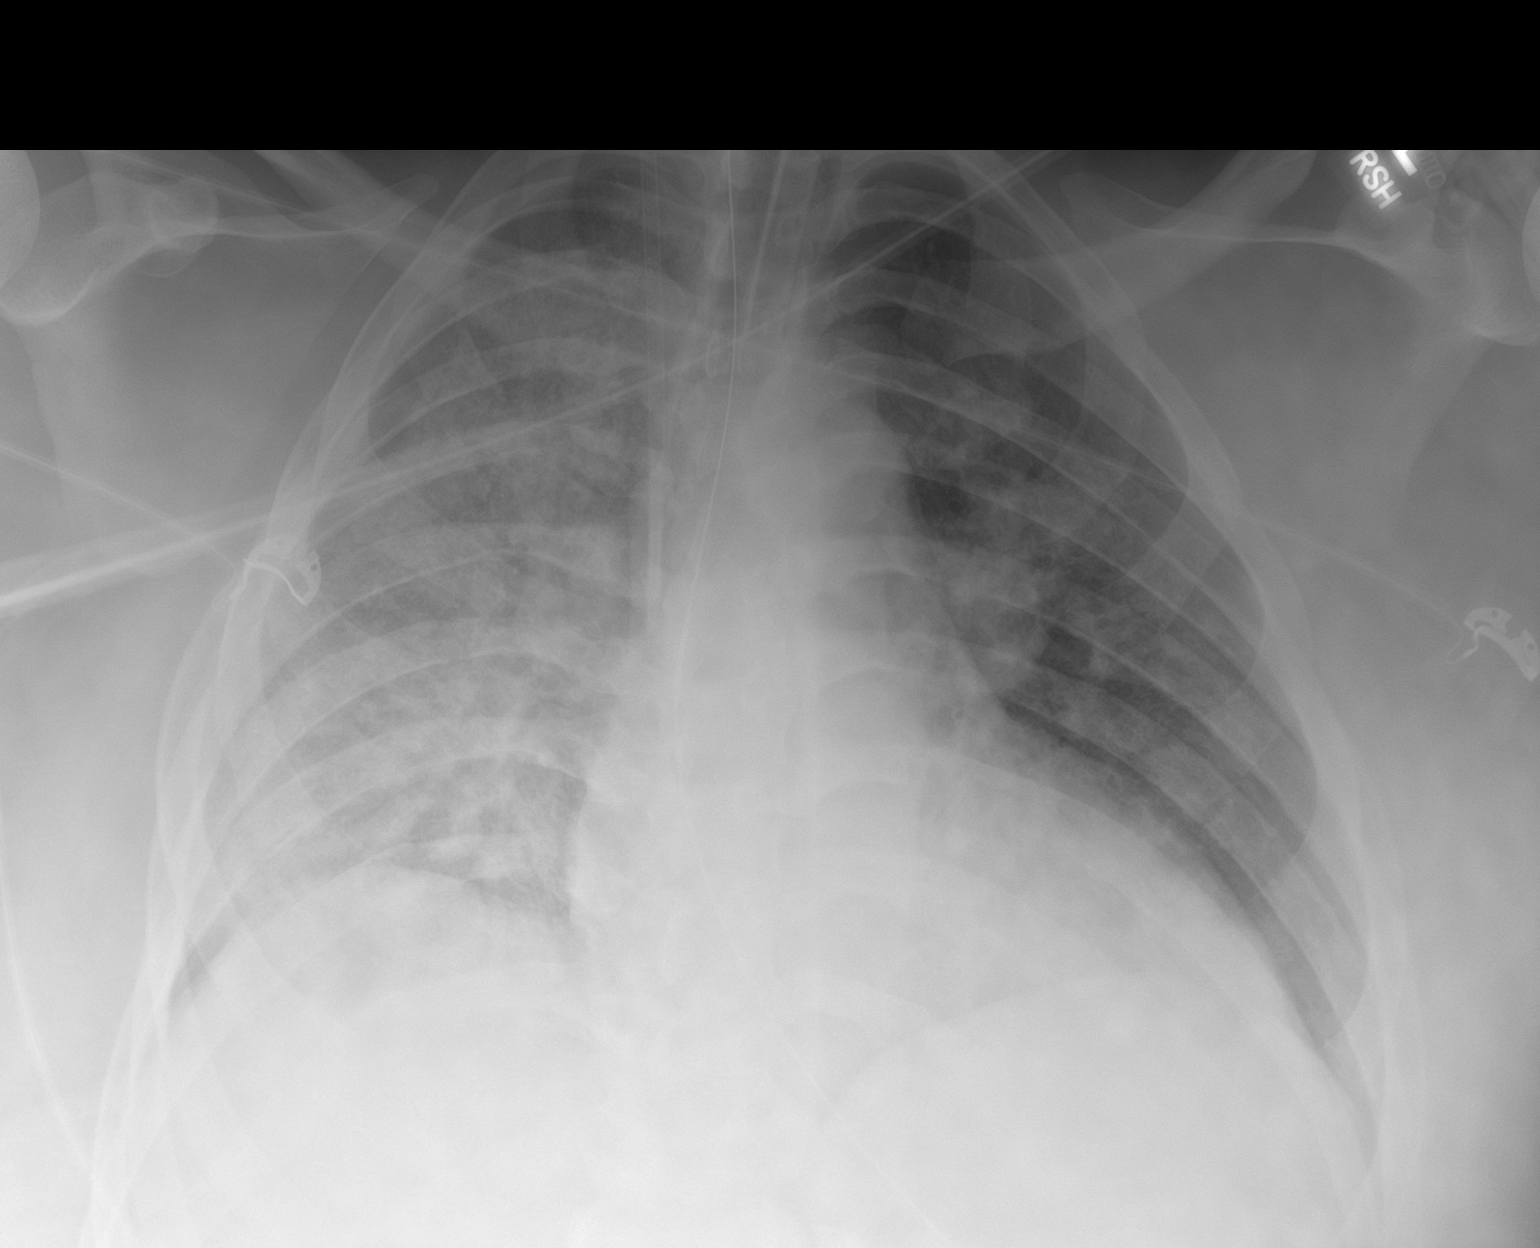

[1 of 1 positions shown; findings below may reference images not displayed]

FINDINGS: Endotracheal tube terminates 3.9 cm above the carina. Right IJ
central line tip is in the SVC. Nasogastric tube is followed into
the stomach with the tip projecting beyond the inferior margin of
the image. Heart is enlarged, stable. Patchy bilateral airspace
opacification persists, asymmetric on the right. There may be
slightly improved aeration in the left lung. No definite pleural
fluid.
IMPRESSION: Patchy bilateral airspace opacification, somewhat asymmetric on the
right, similar to 11/03/2020, and indicative of edema or pneumonia.
